# Patient Record
Sex: Male | Born: 1935 | Race: White | Hispanic: No | Smoking: Former smoker
Health system: Southern US, Community
[De-identification: ages and names within clinical notes are randomized; demographics above are authoritative.]

## PROBLEM LIST (undated history)

## (undated) DIAGNOSIS — I2510 Atherosclerotic heart disease of native coronary artery without angina pectoris: Secondary | ICD-10-CM

## (undated) DIAGNOSIS — E785 Hyperlipidemia, unspecified: Secondary | ICD-10-CM

## (undated) DIAGNOSIS — I4891 Unspecified atrial fibrillation: Secondary | ICD-10-CM

## (undated) DIAGNOSIS — I10 Essential (primary) hypertension: Secondary | ICD-10-CM

## (undated) HISTORY — PX: FRACTURE SURGERY: SHX138

---

## 2003-12-30 ENCOUNTER — Other Ambulatory Visit: Payer: Self-pay

## 2003-12-30 ENCOUNTER — Emergency Department: Payer: Self-pay | Admitting: Emergency Medicine

## 2003-12-31 ENCOUNTER — Emergency Department: Payer: Self-pay | Admitting: Unknown Physician Specialty

## 2006-06-20 ENCOUNTER — Other Ambulatory Visit: Payer: Self-pay

## 2006-06-20 ENCOUNTER — Inpatient Hospital Stay: Payer: Self-pay | Admitting: *Deleted

## 2008-03-16 ENCOUNTER — Emergency Department: Payer: Self-pay | Admitting: Emergency Medicine

## 2008-12-29 ENCOUNTER — Emergency Department: Payer: Self-pay | Admitting: Emergency Medicine

## 2009-06-24 ENCOUNTER — Emergency Department: Payer: Self-pay | Admitting: Emergency Medicine

## 2009-06-30 ENCOUNTER — Emergency Department: Payer: Self-pay | Admitting: Emergency Medicine

## 2009-07-04 ENCOUNTER — Emergency Department: Payer: Self-pay | Admitting: Unknown Physician Specialty

## 2012-02-27 ENCOUNTER — Emergency Department: Payer: Self-pay | Admitting: Emergency Medicine

## 2012-02-27 LAB — CBC WITH DIFFERENTIAL/PLATELET
Basophil %: 0.8 %
Eosinophil #: 0.2 10*3/uL (ref 0.0–0.7)
Eosinophil %: 2.4 %
HCT: 45.8 % (ref 40.0–52.0)
HGB: 15.4 g/dL (ref 13.0–18.0)
MCH: 30.5 pg (ref 26.0–34.0)
MCHC: 33.7 g/dL (ref 32.0–36.0)
Monocyte #: 0.5 x10 3/mm (ref 0.2–1.0)
Monocyte %: 5.6 %
Neutrophil #: 7.2 10*3/uL — ABNORMAL HIGH (ref 1.4–6.5)
Neutrophil %: 76.6 %
Platelet: 209 10*3/uL (ref 150–440)
RBC: 5.06 10*6/uL (ref 4.40–5.90)
WBC: 9.4 10*3/uL (ref 3.8–10.6)

## 2012-02-27 LAB — TROPONIN I: Troponin-I: <0.02 ng/mL

## 2012-02-27 LAB — BASIC METABOLIC PANEL
Anion Gap: 8 (ref 7–16)
BUN: 11 mg/dL (ref 7–18)
Chloride: 106 mmol/L (ref 98–107)
Creatinine: 0.82 mg/dL (ref 0.60–1.30)
Glucose: 109 mg/dL — ABNORMAL HIGH (ref 65–99)
Osmolality: 278 (ref 275–301)

## 2012-03-04 LAB — CULTURE, BLOOD (SINGLE)

## 2012-04-10 ENCOUNTER — Emergency Department: Payer: Self-pay | Admitting: Emergency Medicine

## 2016-11-27 ENCOUNTER — Other Ambulatory Visit: Payer: Self-pay

## 2016-11-27 ENCOUNTER — Inpatient Hospital Stay
Admission: EM | Admit: 2016-11-27 | Discharge: 2016-12-05 | DRG: 871 | Disposition: E | Payer: Medicare Other | Attending: Specialist | Admitting: Specialist

## 2016-11-27 ENCOUNTER — Encounter: Payer: Self-pay | Admitting: Internal Medicine

## 2016-11-27 ENCOUNTER — Emergency Department: Payer: Medicare Other

## 2016-11-27 ENCOUNTER — Inpatient Hospital Stay (HOSPITAL_COMMUNITY)
Admission: AD | Admit: 2016-11-27 | Payer: Self-pay | Source: Other Acute Inpatient Hospital | Admitting: Family Medicine

## 2016-11-27 DIAGNOSIS — Z515 Encounter for palliative care: Secondary | ICD-10-CM | POA: Diagnosis not present

## 2016-11-27 DIAGNOSIS — I719 Aortic aneurysm of unspecified site, without rupture: Secondary | ICD-10-CM | POA: Diagnosis not present

## 2016-11-27 DIAGNOSIS — Z91041 Radiographic dye allergy status: Secondary | ICD-10-CM | POA: Diagnosis not present

## 2016-11-27 DIAGNOSIS — T796XXA Traumatic ischemia of muscle, initial encounter: Secondary | ICD-10-CM

## 2016-11-27 DIAGNOSIS — J9601 Acute respiratory failure with hypoxia: Secondary | ICD-10-CM | POA: Diagnosis present

## 2016-11-27 DIAGNOSIS — I4891 Unspecified atrial fibrillation: Secondary | ICD-10-CM | POA: Diagnosis present

## 2016-11-27 DIAGNOSIS — N390 Urinary tract infection, site not specified: Secondary | ICD-10-CM | POA: Diagnosis present

## 2016-11-27 DIAGNOSIS — S2241XA Multiple fractures of ribs, right side, initial encounter for closed fracture: Secondary | ICD-10-CM | POA: Diagnosis not present

## 2016-11-27 DIAGNOSIS — I10 Essential (primary) hypertension: Secondary | ICD-10-CM | POA: Diagnosis not present

## 2016-11-27 DIAGNOSIS — R29731 NIHSS score 31: Secondary | ICD-10-CM | POA: Diagnosis not present

## 2016-11-27 DIAGNOSIS — R4182 Altered mental status, unspecified: Secondary | ICD-10-CM | POA: Diagnosis not present

## 2016-11-27 DIAGNOSIS — R402132 Coma scale, eyes open, to sound, at arrival to emergency department: Secondary | ICD-10-CM | POA: Diagnosis not present

## 2016-11-27 DIAGNOSIS — I2510 Atherosclerotic heart disease of native coronary artery without angina pectoris: Secondary | ICD-10-CM | POA: Diagnosis not present

## 2016-11-27 DIAGNOSIS — G9340 Encephalopathy, unspecified: Secondary | ICD-10-CM | POA: Diagnosis present

## 2016-11-27 DIAGNOSIS — I63512 Cerebral infarction due to unspecified occlusion or stenosis of left middle cerebral artery: Secondary | ICD-10-CM

## 2016-11-27 DIAGNOSIS — I248 Other forms of acute ischemic heart disease: Secondary | ICD-10-CM | POA: Diagnosis not present

## 2016-11-27 DIAGNOSIS — R6520 Severe sepsis without septic shock: Secondary | ICD-10-CM | POA: Diagnosis present

## 2016-11-27 DIAGNOSIS — A419 Sepsis, unspecified organism: Secondary | ICD-10-CM | POA: Diagnosis not present

## 2016-11-27 DIAGNOSIS — R402222 Coma scale, best verbal response, incomprehensible words, at arrival to emergency department: Secondary | ICD-10-CM | POA: Diagnosis not present

## 2016-11-27 DIAGNOSIS — Z66 Do not resuscitate: Secondary | ICD-10-CM | POA: Diagnosis not present

## 2016-11-27 DIAGNOSIS — R778 Other specified abnormalities of plasma proteins: Secondary | ICD-10-CM

## 2016-11-27 DIAGNOSIS — Z87891 Personal history of nicotine dependence: Secondary | ICD-10-CM | POA: Diagnosis not present

## 2016-11-27 DIAGNOSIS — L089 Local infection of the skin and subcutaneous tissue, unspecified: Secondary | ICD-10-CM | POA: Diagnosis present

## 2016-11-27 DIAGNOSIS — R402342 Coma scale, best motor response, flexion withdrawal, at arrival to emergency department: Secondary | ICD-10-CM | POA: Diagnosis present

## 2016-11-27 DIAGNOSIS — I639 Cerebral infarction, unspecified: Secondary | ICD-10-CM | POA: Diagnosis present

## 2016-11-27 DIAGNOSIS — R7989 Other specified abnormal findings of blood chemistry: Secondary | ICD-10-CM

## 2016-11-27 DIAGNOSIS — I447 Left bundle-branch block, unspecified: Secondary | ICD-10-CM | POA: Diagnosis not present

## 2016-11-27 DIAGNOSIS — S2249XA Multiple fractures of ribs, unspecified side, initial encounter for closed fracture: Secondary | ICD-10-CM | POA: Diagnosis present

## 2016-11-27 DIAGNOSIS — M84431P Pathological fracture, right ulna, subsequent encounter for fracture with malunion: Secondary | ICD-10-CM | POA: Diagnosis present

## 2016-11-27 DIAGNOSIS — E785 Hyperlipidemia, unspecified: Secondary | ICD-10-CM | POA: Diagnosis present

## 2016-11-27 DIAGNOSIS — S40811A Abrasion of right upper arm, initial encounter: Secondary | ICD-10-CM

## 2016-11-27 DIAGNOSIS — T148XXA Other injury of unspecified body region, initial encounter: Secondary | ICD-10-CM

## 2016-11-27 DIAGNOSIS — N179 Acute kidney failure, unspecified: Secondary | ICD-10-CM

## 2016-11-27 DIAGNOSIS — M6282 Rhabdomyolysis: Secondary | ICD-10-CM | POA: Diagnosis present

## 2016-11-27 DIAGNOSIS — S2239XA Fracture of one rib, unspecified side, initial encounter for closed fracture: Secondary | ICD-10-CM | POA: Diagnosis present

## 2016-11-27 DIAGNOSIS — Z23 Encounter for immunization: Secondary | ICD-10-CM

## 2016-11-27 HISTORY — DX: Atherosclerotic heart disease of native coronary artery without angina pectoris: I25.10

## 2016-11-27 HISTORY — DX: Hyperlipidemia, unspecified: E78.5

## 2016-11-27 HISTORY — DX: Essential (primary) hypertension: I10

## 2016-11-27 HISTORY — DX: Unspecified atrial fibrillation: I48.91

## 2016-11-27 LAB — URINALYSIS, COMPLETE (UACMP) WITH MICROSCOPIC
BILIRUBIN URINE: NEGATIVE
Bacteria, UA: NONE SEEN
Glucose, UA: NEGATIVE mg/dL
KETONES UR: NEGATIVE mg/dL
Leukocytes, UA: NEGATIVE
Nitrite: NEGATIVE
PROTEIN: 100 mg/dL — AB
Specific Gravity, Urine: 1.023 (ref 1.005–1.030)
pH: 5 (ref 5.0–8.0)

## 2016-11-27 LAB — COMPREHENSIVE METABOLIC PANEL
ALK PHOS: 91 U/L (ref 38–126)
ALT: 33 U/L (ref 17–63)
AST: 55 U/L — ABNORMAL HIGH (ref 15–41)
Albumin: 4 g/dL (ref 3.5–5.0)
Anion gap: 13 (ref 5–15)
BUN: 57 mg/dL — ABNORMAL HIGH (ref 6–20)
CALCIUM: 9.6 mg/dL (ref 8.9–10.3)
CHLORIDE: 103 mmol/L (ref 101–111)
CO2: 27 mmol/L (ref 22–32)
CREATININE: 1.32 mg/dL — AB (ref 0.61–1.24)
GFR, EST AFRICAN AMERICAN: 55 mL/min — AB (ref >60)
GFR, EST NON AFRICAN AMERICAN: 48 mL/min — AB (ref >60)
Glucose, Bld: 128 mg/dL — ABNORMAL HIGH (ref 65–99)
Potassium: 4.4 mmol/L (ref 3.5–5.1)
Sodium: 143 mmol/L (ref 135–145)
Total Bilirubin: 3.6 mg/dL — ABNORMAL HIGH (ref 0.3–1.2)
Total Protein: 7.5 g/dL (ref 6.5–8.1)

## 2016-11-27 LAB — TROPONIN I
TROPONIN I: 0.36 ng/mL — AB (ref <0.03)
TROPONIN I: 0.38 ng/mL — AB (ref <0.03)
Troponin I: 0.36 ng/mL (ref <0.03)

## 2016-11-27 LAB — CBC WITH DIFFERENTIAL/PLATELET
Basophils Absolute: 0 10*3/uL (ref 0–0.1)
Basophils Relative: 0 %
Eosinophils Absolute: 0 10*3/uL (ref 0–0.7)
Eosinophils Relative: 0 %
HEMATOCRIT: 52.8 % — AB (ref 40.0–52.0)
HEMOGLOBIN: 17.8 g/dL (ref 13.0–18.0)
LYMPHS ABS: 0.5 10*3/uL — AB (ref 1.0–3.6)
Lymphocytes Relative: 6 %
MCH: 30.4 pg (ref 26.0–34.0)
MCHC: 33.8 g/dL (ref 32.0–36.0)
MCV: 90.2 fL (ref 80.0–100.0)
MONO ABS: 0.6 10*3/uL (ref 0.2–1.0)
MONOS PCT: 6 %
NEUTROS ABS: 8.6 10*3/uL — AB (ref 1.4–6.5)
NEUTROS PCT: 88 %
Platelets: 188 10*3/uL (ref 150–440)
RBC: 5.85 MIL/uL (ref 4.40–5.90)
RDW: 14.9 % — AB (ref 11.5–14.5)
WBC: 9.8 10*3/uL (ref 3.8–10.6)

## 2016-11-27 LAB — PROTIME-INR
INR: 1.1
Prothrombin Time: 14.1 seconds (ref 11.4–15.2)

## 2016-11-27 LAB — LACTIC ACID, PLASMA
Lactic Acid, Venous: 1.8 mmol/L (ref 0.5–1.9)
Lactic Acid, Venous: 1.4 mmol/L (ref 0.5–1.9)

## 2016-11-27 LAB — CK: CK TOTAL: 1054 U/L — AB (ref 49–397)

## 2016-11-27 MED ORDER — PIPERACILLIN-TAZOBACTAM 3.375 G IVPB 30 MIN
3.3750 g | Freq: Once | INTRAVENOUS | Status: AC
  Administered 2016-11-27: 3.375 g via INTRAVENOUS
  Filled 2016-11-27: qty 50

## 2016-11-27 MED ORDER — TETANUS-DIPHTH-ACELL PERTUSSIS 5-2.5-18.5 LF-MCG/0.5 IM SUSP
0.5000 mL | Freq: Once | INTRAMUSCULAR | Status: AC
  Administered 2016-11-27: 0.5 mL via INTRAMUSCULAR

## 2016-11-27 MED ORDER — DILTIAZEM HCL 25 MG/5ML IV SOLN
10.0000 mg | Freq: Once | INTRAVENOUS | Status: DC
  Filled 2016-11-27: qty 5

## 2016-11-27 MED ORDER — FENTANYL CITRATE (PF) 100 MCG/2ML IJ SOLN
INTRAMUSCULAR | Status: AC
  Filled 2016-11-27: qty 2

## 2016-11-27 MED ORDER — SODIUM CHLORIDE 0.9 % IV BOLUS (SEPSIS)
1000.0000 mL | Freq: Once | INTRAVENOUS | Status: AC
  Administered 2016-11-27: 1000 mL via INTRAVENOUS

## 2016-11-27 MED ORDER — DILTIAZEM HCL 100 MG IV SOLR
5.0000 mg/h | INTRAVENOUS | Status: DC
  Administered 2016-11-27 – 2016-11-28 (×2): 5 mg/h via INTRAVENOUS
  Filled 2016-11-27 (×2): qty 100

## 2016-11-27 MED ORDER — FENTANYL CITRATE (PF) 100 MCG/2ML IJ SOLN
50.0000 ug | Freq: Once | INTRAMUSCULAR | Status: AC
  Administered 2016-11-27: 50 ug via INTRAVENOUS

## 2016-11-27 MED ORDER — LACTATED RINGERS IV BOLUS (SEPSIS)
1000.0000 mL | Freq: Once | INTRAVENOUS | Status: AC
  Administered 2016-11-27: 1000 mL via INTRAVENOUS

## 2016-11-27 MED ORDER — PIPERACILLIN-TAZOBACTAM 3.375 G IVPB 30 MIN
INTRAVENOUS | Status: AC
  Filled 2016-11-27: qty 50

## 2016-11-27 MED ORDER — METOPROLOL TARTRATE 5 MG/5ML IV SOLN
2.5000 mg | Freq: Once | INTRAVENOUS | Status: AC
  Administered 2016-11-27: 2.5 mg via INTRAVENOUS
  Filled 2016-11-27: qty 5

## 2016-11-27 MED ORDER — DILTIAZEM LOAD VIA INFUSION
10.0000 mg | Freq: Once | INTRAVENOUS | Status: DC
  Filled 2016-11-27: qty 10

## 2016-11-27 MED ORDER — FENTANYL CITRATE (PF) 100 MCG/2ML IJ SOLN
50.0000 ug | Freq: Once | INTRAMUSCULAR | Status: AC
  Administered 2016-11-27: 50 ug via INTRAVENOUS
  Filled 2016-11-27: qty 2

## 2016-11-27 MED ORDER — VANCOMYCIN HCL IN DEXTROSE 1-5 GM/200ML-% IV SOLN
1000.0000 mg | Freq: Once | INTRAVENOUS | Status: AC
  Administered 2016-11-27: 1000 mg via INTRAVENOUS

## 2016-11-27 MED ORDER — PIPERACILLIN-TAZOBACTAM 3.375 G IVPB 30 MIN
3.3750 g | Freq: Once | INTRAVENOUS | Status: AC
  Administered 2016-11-27: 3.375 g via INTRAVENOUS

## 2016-11-27 MED ORDER — MAGNESIUM SULFATE 2 GM/50ML IV SOLN
2.0000 g | INTRAVENOUS | Status: AC
  Administered 2016-11-27: 2 g via INTRAVENOUS
  Filled 2016-11-27: qty 50

## 2016-11-27 MED ORDER — DILTIAZEM HCL 25 MG/5ML IV SOLN
10.0000 mg | Freq: Once | INTRAVENOUS | Status: AC
  Administered 2016-11-27: 10 mg via INTRAVENOUS
  Filled 2016-11-27: qty 5

## 2016-11-27 NOTE — ED Notes (Addendum)
Pt has rhonchi on exhalation, no inhalation rhonch to bilateral upper lobes. Pt with diminished lower lobe breath sounds, pt remains in Lbbb with multifocal PVCs.

## 2016-11-27 NOTE — ED Triage Notes (Addendum)
Pt comes in as Emergency Traffic.  Per EMS pt comes from home and has not been heard from "in a few days".  A friend went to check on him today and found him on the floor covered in blood and feces.  Pt responds to pain only.  Pt has an open fracture on left arm.

## 2016-11-27 NOTE — ED Notes (Signed)
Dr. Scotty Court in to reexamine.

## 2016-11-27 NOTE — ED Provider Notes (Signed)
Clinical Course as of Nov 28 2330  Wynelle Link 22-Dec-2016  1741 CK elevated, will give IVF for hydration and renal protection.   [PS]  1817 Pt now with increased WOB, accessory muscle use, hypoxia to 87% on 2L [place]. Placed on NRB. Will repeat CXR  [PS]  1818 Repeat ekg obtained, interpreted by me, shows a fib, rate 97, left axis, LBBB. 2 PVCs on the strip. Not significantly changed from initial ed ekg.  [PS]  1847 Persistent high bp, 180/80 just now. Has expiratory crackles, but clear on inspiration, suspect mucous in airway. Repeat cxr clear. Will give trial of fentanyl. Considering repeat CT with IV contrast/angio, but I'm hesitant to do so given the rhabdomyolysis which will put him at high risk for renal injury.  [PS]  2036 Persistent afib/rvr. Will start dilt drip, careful titration  [PS]  2103 Bp normalized. Respiratory status stable - so2 98% on nrb. Tachypnea. Trop stable, not sig. Increasing.  Likely elevated due to rhabdo  [PS]  2300 Update from [facility] is that although they have accepted the patient, they currently do not plan to make a bed avaialble to him, essentially preventing transfer.  [facility] also still has no timeframe on when they might be able to assign a bed to the patient.   [PS]    Clinical Course User Index [PS] Sharman Cheek, MD     ----------------------------------------- 11:33 PM on 2016/12/22 -----------------------------------------  Discussed with [facility] neurosurgery to review the CT finding of petechial hemorrhage, they report there is nothing to do with this and not neurosurgical.  Case d/w hospitalist for admission to [facility].  Pt is a [place] patient, but currently not stable for transfer to [place] due to afib with RVR, elevated troponin, labile blood pressures.  Notification form sent to [facility].   Additional critical care performed by me: CRITICAL CARE Performed by: Scotty Court, Alinah Sheard   Total critical care time: 60 minutes  Critical care time was exclusive of separately billable  procedures and treating other patients.  Critical care was necessary to treat or prevent imminent or life-threatening deterioration.  Critical care was time spent personally by me on the following activities: development of treatment plan with patient and/or surrogate as well as nursing, discussions with consultants, evaluation of patient's response to treatment, examination of patient, obtaining history from patient or surrogate, ordering and performing treatments and interventions, ordering and review of laboratory studies, ordering and review of radiographic studies, pulse oximetry and re-evaluation of patient's condition.  Final diagnoses:  Atrial fibrillation with rapid ventricular response (HCC)  Troponin level elevated  Acute ischemic left MCA stroke (HCC)  Abrasion of right arm, initial encounter  Acute renal failure, unspecified acute renal failure type (HCC)  Closed fracture of multiple ribs of right side, initial encounter  Traumatic rhabdomyolysis, initial encounter [facility])       Sharman Cheek, MD 12/22/2016 2335

## 2016-11-27 NOTE — ED Notes (Signed)
Pt rolled and cleansed of dried blood and stool. Right elbow cleansed and dressed. Pt with skin tear noted to right elbow encapsulating right elbow, pt with dried abrasions and scabbed lacerations noted to right forearm. Right arm is bruised from fingers to mid upper arm. Pt with bruising noted to right upper tubercle, right forehead and posterior skull. Pt with red discrete bumps noted to lower back and bruising noted to buttocks. Pt with abrasion without drainage noted to right lateral knee.

## 2016-11-27 NOTE — ED Provider Notes (Signed)
[facility] Emergency Department Provider Note ____________________________________________   I have reviewed the triage vital signs and the triage nursing note.  HISTORY  Chief Complaint Fall   Historian Patient history limited by altered mental status. Level 5 caveat  History per distant family member as well as EMS report.  HPI [place] Makail Watling. is a ((( Unsure of Birthdate)) 83 y.o. male brought in by EMS after being found unresponsive on the floor, apparently last had been spoken 2/24 hours ago.  Patient was found in a pool of blood with an abrasion to his right forearm with a deformity there. He was not following much commands and minimally verbal but appeared to be breathing okay and protecting his airway.  Unclear whether or not the patient was having any pain although he seem to moan when you press on his abdomen.      No past medical history on file.  There are no active problems to display for this patient.   No past surgical history on file.  Prior to Admission medications   Not on File    Not on File  No family history on file.  Social History Social History  Substance Use Topics  . Smoking status: Not on file  . Smokeless tobacco: Not on file  . Alcohol use Not on file    Review of Systems   history limited by altered mental status Level 5 caveat  ____________________________________________   PHYSICAL EXAM:  VITAL SIGNS: ED Triage Vitals [12/02/2016 1053]  Enc Vitals Group     BP 110/88     Pulse Rate (!) 126     Resp (!) 22     Temp      Temp src      SpO2 95 %     Weight      Height      Head Circumference      Peak Flow      Pain Score      Pain Loc      Pain Edu?      Excl. in GC?      Constitutional: Alert to voice, but only occasionally answering questions. HEENT   Head: Normocephalic and atraumatic.      Eyes: Conjunctivae are normal. Pupils equal and round.       Ears:          Nose: No congestion/rhinnorhea.   Mouth/Throat: Mucous membranes are severely dry.   Neck: No stridor.  no midline C-spine step-offs. Cardiovascular/Chest: tachycardic, regular rhythm.  No murmurs, rubs, or gallops. Respiratory: Normal respiratory effort without tachypnea nor retractions. Breath sounds are clear and equal bilaterally. No wheezes/rales/rhonchi. Gastrointestinal: Soft.possibly mildly distended. Questionable tenderness but without any guarding or rebound.  Genitourinary/rectal:Deferred Musculoskeletal: pelvis stable. No thoracic or lumbar step-offs. Mild ecchymosis left flank. Pelvis stable. No lower extremity deformities. Right forearm deformity with an abrasion of the right forearm. Neurologic:  no facial droop. There is some aphasia although unclear due to extremely dry mouth. Appears to be moving 4 extremities, but extremely weak overall. Skin:  Skin is warm, dry.   ____________________________________________  LABS (pertinent positives/negatives) I, Governor Rooks, MD the attending physician have reviewed the labs noted below.  Labs Reviewed  COMPREHENSIVE METABOLIC PANEL - Abnormal; Notable for the following:       Result Value   Glucose, Bld 128 (*)    BUN 57 (*)    Creatinine, Ser 1.32 (*)    AST 55 (*)  Total Bilirubin 3.6 (*)    GFR calc non Af Amer 48 (*)    GFR calc Af Amer 55 (*)    All other components within normal limits  TROPONIN I - Abnormal; Notable for the following:    Troponin I 0.36 (*)    All other components within normal limits  CBC WITH DIFFERENTIAL/PLATELET - Abnormal; Notable for the following:    HCT 52.8 (*)    RDW 14.9 (*)    Neutro Abs 8.6 (*)    Lymphs Abs 0.5 (*)    All other components within normal limits  URINALYSIS, COMPLETE (UACMP) WITH MICROSCOPIC - Abnormal; Notable for the following:    Color, Urine AMBER (*)    APPearance CLOUDY (*)    Hgb urine dipstick MODERATE (*)    Protein, ur 100 (*)    Squamous Epithelial  / LPF 0-5 (*)    All other components within normal limits  BLOOD GAS, VENOUS - Abnormal; Notable for the following:    pO2, Ven <31.0 (*)    All other components within normal limits  TROPONIN I - Abnormal; Notable for the following:    Troponin I 0.36 (*)    All other components within normal limits  URINE CULTURE  CULTURE, BLOOD (ROUTINE X 2)  CULTURE, BLOOD (ROUTINE X 2)  LACTIC ACID, PLASMA  PROTIME-INR  LACTIC ACID, PLASMA  CK  TYPE AND SCREEN  TYPE AND SCREEN    ____________________________________________    EKG I, Governor Rooks, MD, the attending physician have personally viewed and interpreted all ECGs.  114 bpm. Atrial fibrillation with rapid ventricular response. Left bundle branch block. Occasional PVC. Left axis deviation.  No prior EKG for comparison. ____________________________________________  RADIOLOGY All Xrays were viewed by me.  Imaging interpreted by Radiologist, and I, Governor Rooks, MD the attending physician have reviewed the radiologist interpretation noted below.  CT chest and pelvis without contrast:  IMPRESSION: 1. Nondisplaced fractures of the right anterior third and fourth ribs with no pneumothorax. 2. Emphysematous changes in the lungs. Atherosclerotic changes in the thoracic aorta. 3. Significant perinephric stranding bilaterally, right greater than left. This is an age-indeterminate finding. There is no associated hydronephrosis. I suspect the stranding on the left is chronic. However, the stranding on the right is much greater and an the underlying acute process is not excluded. A small fluid collection between the liver and right kidney is thought to be renal in origin. Renal injury or infection could result in these findings. Sequela of acute renal insufficiency is a possibility. Recommend clinical correlation and follow-up as clinically warranted. 4. Atherosclerotic changes in the abdominal aorta and branching vessels. 5.  Age-indeterminate anterior wedging of L1 and L4. Aortic aneurysm NOS (ICD10-I71.9).   CT head and cervical spine without contrast:  IMPRESSION: 1. Moderate-sized area of abnormal mixed density in the left hemisphere centered at the insula and operculum most resembles a subacute anterior division Left MCA infarct with petechial hemorrhage, with evidence of a hyperdense vessel in the left sylvian fissure. Follow-up Brain MRI and intracranial MRA would confirm. 2. No malignant hemorrhagic transformation or intracranial mass effect. 3. Broad-based scalp hematoma without underlying skull fracture. 4. No acute fracture or listhesis in the cervical spine. 5. Extensive great vessel and carotid artery calcified Atherosclerosis.  Chest portable:  IMPRESSION: No active disease.  Forearm right:  IMPRESSION: 1. Chronic and healed but abnormally angulated distal right radius fracture. Associated chronic ulnar styloid fracture. 2.  No acute osseous abnormality identified. 3.  Calcified peripheral vascular disease. __________________________________________  PROCEDURES  Procedure(s) performed: None  Critical Care performed: CRITICAL CARE Performed by: Governor Rooks   Total critical care time: 75 minutes  Critical care time was exclusive of separately billable procedures and treating other patients.  Critical care was necessary to treat or prevent imminent or life-threatening deterioration.  Critical care was time spent personally by me on the following activities: development of treatment plan with patient and/or surrogate as well as nursing, discussions with consultants, evaluation of patient's response to treatment, examination of patient, obtaining history from patient or surrogate, ordering and performing treatments and interventions, ordering and review of laboratory studies, ordering and review of radiographic studies, pulse oximetry and re-evaluation of patient's  condition.   ____________________________________________   ED COURSE / ASSESSMENT AND PLAN  Pertinent labs & imaging results that were available during my care of the patient were reviewed by me and considered in my medical decision making (see chart for details).    Mr. Segal arrived by EMS with altered mental status, looked extremely dry and had trouble speaking felt to be due to both altered mental status as well as dry mouth. Patient had abrasion over his right wrist with a deformity which initially was concerning for open fracture, however x-ray confirmed chronic deformity.  Patient had hypotension as well as tachycardia and there is concern for possible sepsis, patient was given broad-spectrum antibiotics. Laboratory studies ultimately showed fairly reassuring with no elevated lactate, or elevated white blood cell count. No clear sources of infection.  Patient found to have renal dysfunction, no prior for comparison, but with elevated BUN and creatinine, likely component of dehydration. Patient's troponin is elevated at 0.36. Patient is having rapid A. fib with left bundle branch block. There is no prior EKG to compare whether or not he has a history of A. fib, or history of left bundle branch block.  In any case and a coagulation including aspirin or heparin is problematic given his punctate hemorrhage of his ischemic stroke as well as possible intra-abdominal bleeding around the kidney from the trauma.  Blood pressure without hypotension. Heart rate came down from the 130s down to 110. I did give patient a dose of metoprolol.  Patient is a family member, unclear whether or not this was an uncle or the nephew, but in any case this person states that he checks up and speaks with the patient once per morning. He had spoken with him on Friday morning and then had not heard from him all day Saturday and checked on him today and was found to be on the floor with a bloody right arm and  confused.  This family member was unsure of the patient's full name, 8 of birth, and social security number, and there was little uncomfortable necessarily using this family members as a surrogate for decision making.  Patient appeared to be protecting his airway although is confused. No indication for emergent intubation.  Patient will need hospitalization for subacute ischemic with hemorrhagic converted stroke. Because of the traumatic aspect of the multiple rib fractures and this possibility of intra-abdominal bleeding although likely stable at this point a day or so from the onset, I did initially plan for transfer to a larger center that would have backup trauma surgery as well as neurology.  I initially spoke with [facility] and spoke with neurology who did not recommend any additional acute intervention, but would need hospital management for the stroke. I spoke with the trauma surgeon who  indicated no acute emergency intervention, the load be happy to consult. The hospitalist was happy to admit, however there is no stepdown beds available.  I spoke with [facility] and the same situation, there is no stepdown beds available.  Plan Will be to assess the patient's placement on these admission for transfer lists over the next several hours. An alternative plan might be too potentially stated our hospital although we do not have trauma surgery.  Patient care transferred to Dr. Scotty Court at shift change 4 PM.  Repeat troponin was ordered. An anticoagulation is to be concerning given above punctate hemorrhagic stroke, as well as the possible of interim abdominal traumatic bleeding.  DIFFERENTIAL DIAGNOSIS: Including but not limited to sepsis, stroke, rhabdomyolysis, renal failure, heart failure, heart attack, etc.   CONSULTATIONS:   Multiple consultations with the [employer], I spoke with Dr. Jerrell Belfast with neurology, as well as the on-call trauma doctor who both agreed patient could be seen in  consultation through the hospitalist service at [facility]. I spoke with Dr. Adrian Blackwater, hospitalist at [facility] who did accept in transfer, was placed on waiting list due to no stepdown peds. Patient not qualifying for ICU level care given not on drips or on a ventilator at this time.  I spoke with ICU physician at [facility], Dr. Rito Ehrlich (sp?), who is agreeable to admit, but was waiting for a couple of other phone discussions before formal agreement for transfer.  I was called back to the patient would not be accepted to the ICU, but would be accepted for stepdown bed which is unavailable at this point time. Unclear when this that might be available.   Patient / Family / Caregiver informed of clinical course, medical decision-making process, and agree with plan.   ___________________________________________   FINAL CLINICAL IMPRESSION(S) / ED DIAGNOSES   Final diagnoses:  Atrial fibrillation with rapid ventricular response (HCC)  Troponin level elevated  Acute ischemic left MCA stroke (HCC)  Abrasion of right arm, initial encounter  Acute renal failure, unspecified acute renal failure type (HCC)  Closed fracture of multiple ribs of right side, initial encounter              Note: This dictation was prepared with Dragon dictation. Any transcriptional errors that result from this process are unintentional    Governor Rooks, MD 12-24-16 1626

## 2016-11-27 NOTE — ED Notes (Signed)
Neuro status unchanged, pt with improved breath sounds in all lung fields.

## 2016-11-27 NOTE — H&P (Signed)
[facility] NAME: Kyle Vega    MR#:  [id number]  DATE OF BIRTH:  01/04/1931  DATE OF ADMISSION:  11/21/2016  PRIMARY CARE PHYSICIAN: [facility] [place]   REQUESTING/REFERRING PHYSICIAN: Joni Fears, MD  CHIEF COMPLAINT:   Chief Complaint  Patient presents with  . Fall    HISTORY OF PRESENT ILLNESS:  Kyle Vega  is a 83 y.o. male who presents after being found down at home. Last known well was 2 days ago. Patient was found here on CT scan to have left MCA territory stroke. He does open his eyes to verbal command, but does not provide any history otherwise. He was also found to have rhabdomyolysis, right-sided weakness, skin lesions likely infected.  On imaging he also was found to have 2 rib fractures, and some perinephric stranding likely indicating infection. He met severe sepsis criteria. Hospitalists were called for admission  PAST MEDICAL HISTORY:   Past Medical History:  Diagnosis Date  . Atrial fibrillation ([place])   . CAD (coronary artery disease)   . HLD (hyperlipidemia)   . HTN (hypertension)     PAST SURGICAL HISTORY:   Past Surgical History:  Procedure Laterality Date  . FRACTURE SURGERY      SOCIAL HISTORY:   Social History  Substance Use Topics  . Smoking status: Former Research scientist (life sciences)  . Smokeless tobacco: Not on file  . Alcohol use No    FAMILY HISTORY:   Family History  Problem Relation Age of Onset  . Family history unknown: Yes    DRUG ALLERGIES:  Not on File  MEDICATIONS AT HOME:   Prior to Admission medications   Not on File    REVIEW OF SYSTEMS:  Review of Systems  Unable to perform ROS: Acuity of condition     VITAL SIGNS:   Vitals:   11/13/2016 2300 12/02/2016 2315 11/17/2016 2330 12/03/2016 2345  BP: 106/66 121/70 (!) 141/73 (!) 141/80  Pulse: 76 (!) 58 (!) 51 (!) 36  Resp: (!) 25 (!) 23 (!) 29 (!) 29  Temp:      TempSrc:      SpO2: 98% 96% 97% 99%   Wt  Readings from Last 3 Encounters:  No data found for Wt    PHYSICAL EXAMINATION:  Physical Exam  Vitals reviewed. Constitutional: He appears well-developed and well-nourished. No distress.  HENT:  Head: Normocephalic and atraumatic.  Eyes: Pupils are equal, round, and reactive to light. Conjunctivae and EOM are normal. No scleral icterus.  Neck: Normal range of motion. Neck supple. No JVD present. No thyromegaly present.  Cardiovascular: Intact distal pulses.  Exam reveals no gallop and no friction rub.   No murmur heard. Tachycardic, irregular rhythm  Respiratory: Breath sounds normal. He is in respiratory distress. He has no wheezes. He has no rales.  GI: Soft. Bowel sounds are normal. He exhibits no distension. There is no tenderness.  Musculoskeletal: Normal range of motion. He exhibits no edema.  No arthritis, no gout  Lymphadenopathy:    He has no cervical adenopathy.  Neurological: He is alert. No cranial nerve deficit.  Patient will open his eyes and track, but is not speaking. He has spontaneous movement of his left upper and lower extremities, less spontaneous movement of his right lower extremity, and a right upper extremity paralysis.  Skin: Skin is warm and dry. No rash noted. There is erythema (with wounds and surrounding ecchymosis of the right upper extremity).  Psychiatric:  Unable to assess due to patient condition    LABORATORY PANEL:   CBC  Recent Labs Lab 11/20/2016 1104  WBC 9.8  HGB 17.8  HCT 52.8*  PLT 188   ------------------------------------------------------------------------------------------------------------------  Chemistries   Recent Labs Lab 11/06/2016 1104  NA 143  K 4.4  CL 103  CO2 27  GLUCOSE 128*  BUN 57*  CREATININE 1.32*  CALCIUM 9.6  AST 55*  ALT 33  ALKPHOS 91  BILITOT 3.6*   ------------------------------------------------------------------------------------------------------------------  Cardiac Enzymes  Recent  Labs Lab 11/15/2016 1838  TROPONINI 0.38*   ------------------------------------------------------------------------------------------------------------------  RADIOLOGY:  Ct Abdomen Pelvis Wo Contrast  Result Date: 11/08/2016 CLINICAL DATA:  Patient found down with acute mental status change. EXAM: CT CHEST, ABDOMEN AND PELVIS WITHOUT CONTRAST TECHNIQUE: Multidetector CT imaging of the chest, abdomen and pelvis was performed following the standard protocol without IV contrast. COMPARISON:  None. FINDINGS: CT CHEST FINDINGS Cardiovascular: Air in the right jugular, right subclavian, and left brachiocephalic veins is likely due to recent IV placement or injection. Atherosclerotic changes are seen in the nonaneurysmal thoracic aorta and its branching vessels. Cardiomegaly is noted without effusion. The central pulmonary arteries are normal in caliber. Mediastinum/Nodes: No effusions. The thyroid and esophagus are normal. No adenopathy. No mediastinal air. Lungs/Pleura: Central airways are normal. No pneumothorax. Emphysematous changes seen in the lungs. No nodule, mass, or focal infiltrate. Musculoskeletal: There are nondisplaced fractures of the right third and fourth anterior ribs with no pneumothorax. CT ABDOMEN PELVIS FINDINGS Hepatobiliary: Probable tiny cyst in the liver on series 2, image 51, too small to characterize. Another seen on image 59 and perhaps another on image 67. No solid masses. The gallbladder is unremarkable. Pancreas: Unremarkable. No pancreatic ductal dilatation or surrounding inflammatory changes. Spleen: Normal in size without focal abnormality. Adrenals/Urinary Tract: An adenoma is seen in the right adrenal gland. The left adrenal gland is normal. Vascular calcifications are associated with both kidneys. No renal stones or hydronephrosis. Significant perinephric stranding seen bilaterally, right greater than left. No ureterectasis or ureteral stones. The bladder is decompressed with  a Foley catheter. Air in the pelvis is thought to be within decompressed bladder. Stomach/Bowel: The stomach and small bowel are normal. The colon is otherwise normal. The appendix is normal. Vascular/Lymphatic: Atherosclerotic changes are seen in the nonaneurysmal abdominal aorta, iliac vessels, and femoral vessels. No adenopathy. Reproductive: Prostate is unremarkable. Other: There is significant stranding and background both kidneys as above, particularly on the right. There is an elliptical collection of fluid between the right kidney and liver measuring 3.9 by 1.9 cm. This is favored to be due to the right renal stranding in is no underlying hepatic abnormality is identified. Musculoskeletal: Anterior wedging of L1 and L4 is age indeterminate with no acute fracture line seen. IMPRESSION: 1. Nondisplaced fractures of the right anterior third and fourth ribs with no pneumothorax. 2. Emphysematous changes in the lungs. Atherosclerotic changes in the thoracic aorta. 3. Significant perinephric stranding bilaterally, right greater than left. This is an age-indeterminate finding. There is no associated hydronephrosis. I suspect the stranding on the left is chronic. However, the stranding on the right is much greater and an the underlying acute process is not excluded. A small fluid collection between the liver and right kidney is thought to be renal in origin. Renal injury or infection could result in these findings. Sequela of acute renal insufficiency is a possibility. Recommend clinical correlation and follow-up as clinically warranted. 4. Atherosclerotic changes in the abdominal aorta and  branching vessels. 5. Age-indeterminate anterior wedging of L1 and L4. Aortic aneurysm NOS (ICD10-I71.9). Electronically Signed   By: Dorise Bullion III M.D   On: 11/19/2016 12:25   Dg Forearm Right  Result Date: 11/26/2016 CLINICAL DATA:  Male of unknown age with right forearm deformity, unable to obtain history from  patient. Presumed trauma. EXAM: RIGHT FOREARM - 2 VIEW COMPARISON:  None. FINDINGS: Lateral and volar angulated deformity of the distal right radius metadiaphysis appears chronic and healed. There is an associated chronic appearing ulnar styloid fracture. No superimposed carpal bone dislocation identified, although there may be associated carpal degeneration. The underlying bone mineralization appears normal. Grossly normal alignment at the right elbow. Calcified peripheral vascular disease in the right forearm. IMPRESSION: 1. Chronic and healed but abnormally angulated distal right radius fracture. Associated chronic ulnar styloid fracture. 2.  No acute osseous abnormality identified. 3. Calcified peripheral vascular disease. Electronically Signed   By: Genevie Ann M.D.   On: 11/06/2016 11:41   Ct Head Wo Contrast  Result Date: 11/19/2016 CLINICAL DATA:  Male of unknown age found down after not being heard from in a few days. Found covered in blood and feces. EXAM: CT HEAD WITHOUT CONTRAST CT CERVICAL SPINE WITHOUT CONTRAST TECHNIQUE: Multidetector CT imaging of the head and cervical spine was performed following the standard protocol without intravenous contrast. Multiplanar CT image reconstructions of the cervical spine were also generated. COMPARISON:  None. FINDINGS: CT HEAD FINDINGS Brain: There is an oval 4 x 6 cm area of hypodensity involving both gray and white matter in the anterior left MCA territory affecting the insula and frontal operculum. Lesser involvement of the anterior left temporal lobe. Heterogeneous density within the affected area is compatible with petechial hemorrhage (coronal image 22). In the left sylvian fissure there is a hyperdense vessel (coronal image 28). There is no significant mass effect. No intracranial midline shift. Patent left lateral ventricle. No ventriculomegaly. No other intracranial hemorrhage identified. Gray-white matter differentiation elsewhere appears within normal  limits. Vascular: Calcified atherosclerosis at the skull base. Hyperdense vessel suspected in the left sylvian fissure as stated above. Skull: Osteopenia.  No skull fracture identified. Sinuses/Orbits: Mild anterior ethmoid and mild to moderate right sphenoid sinus mucosal thickening. Other visible paranasal sinuses and mastoids are well pneumatized. Other: Visualized orbit soft tissues are within normal limits. There is some calcified scalp vessel atherosclerosis. There is a mild broad-based vertex and right greater than left posterior convexity scalp hematoma. CT CERVICAL SPINE FINDINGS Alignment: Mildly exaggerated cervical lordosis. Bilateral posterior element alignment is within normal limits. Cervicothoracic junction alignment is within normal limits. Skull base and vertebrae: Osteopenia. Visualized skull base is intact. No atlanto-occipital dissociation. No cervical spine fracture identified. Soft tissues and spinal canal: No prevertebral fluid or swelling. No visible canal hematoma. Extensive bilateral carotid calcified atherosclerosis. Small volume retained secretions in the pharynx. Disc levels: Degenerative changes primarily at the anterior C1-odontoid, including joint space loss osteophytosis and subchondral cysts. Upper chest: Visible upper thoracic levels appear grossly intact. Centrilobular emphysema in the lung apices. Extensive calcified proximal great vessels. IMPRESSION: 1. Moderate-sized area of abnormal mixed density in the left hemisphere centered at the insula and operculum most resembles a subacute anterior division Left MCA infarct with petechial hemorrhage, with evidence of a hyperdense vessel in the left sylvian fissure. Follow-up Brain MRI and intracranial MRA would confirm. 2. No malignant hemorrhagic transformation or intracranial mass effect. 3. Broad-based scalp hematoma without underlying skull fracture. 4. No acute fracture or listhesis  in the cervical spine. 5. Extensive great  vessel and carotid artery calcified atherosclerosis. Electronically Signed   By: Genevie Ann M.D.   On: 11/07/2016 12:11   Ct Chest Wo Contrast  Result Date: 11/18/2016 CLINICAL DATA:  Patient found down with acute mental status change. EXAM: CT CHEST, ABDOMEN AND PELVIS WITHOUT CONTRAST TECHNIQUE: Multidetector CT imaging of the chest, abdomen and pelvis was performed following the standard protocol without IV contrast. COMPARISON:  None. FINDINGS: CT CHEST FINDINGS Cardiovascular: Air in the right jugular, right subclavian, and left brachiocephalic veins is likely due to recent IV placement or injection. Atherosclerotic changes are seen in the nonaneurysmal thoracic aorta and its branching vessels. Cardiomegaly is noted without effusion. The central pulmonary arteries are normal in caliber. Mediastinum/Nodes: No effusions. The thyroid and esophagus are normal. No adenopathy. No mediastinal air. Lungs/Pleura: Central airways are normal. No pneumothorax. Emphysematous changes seen in the lungs. No nodule, mass, or focal infiltrate. Musculoskeletal: There are nondisplaced fractures of the right third and fourth anterior ribs with no pneumothorax. CT ABDOMEN PELVIS FINDINGS Hepatobiliary: Probable tiny cyst in the liver on series 2, image 51, too small to characterize. Another seen on image 74 and perhaps another on image 67. No solid masses. The gallbladder is unremarkable. Pancreas: Unremarkable. No pancreatic ductal dilatation or surrounding inflammatory changes. Spleen: Normal in size without focal abnormality. Adrenals/Urinary Tract: An adenoma is seen in the right adrenal gland. The left adrenal gland is normal. Vascular calcifications are associated with both kidneys. No renal stones or hydronephrosis. Significant perinephric stranding seen bilaterally, right greater than left. No ureterectasis or ureteral stones. The bladder is decompressed with a Foley catheter. Air in the pelvis is thought to be within  decompressed bladder. Stomach/Bowel: The stomach and small bowel are normal. The colon is otherwise normal. The appendix is normal. Vascular/Lymphatic: Atherosclerotic changes are seen in the nonaneurysmal abdominal aorta, iliac vessels, and femoral vessels. No adenopathy. Reproductive: Prostate is unremarkable. Other: There is significant stranding and background both kidneys as above, particularly on the right. There is an elliptical collection of fluid between the right kidney and liver measuring 3.9 by 1.9 cm. This is favored to be due to the right renal stranding in is no underlying hepatic abnormality is identified. Musculoskeletal: Anterior wedging of L1 and L4 is age indeterminate with no acute fracture line seen. IMPRESSION: 1. Nondisplaced fractures of the right anterior third and fourth ribs with no pneumothorax. 2. Emphysematous changes in the lungs. Atherosclerotic changes in the thoracic aorta. 3. Significant perinephric stranding bilaterally, right greater than left. This is an age-indeterminate finding. There is no associated hydronephrosis. I suspect the stranding on the left is chronic. However, the stranding on the right is much greater and an the underlying acute process is not excluded. A small fluid collection between the liver and right kidney is thought to be renal in origin. Renal injury or infection could result in these findings. Sequela of acute renal insufficiency is a possibility. Recommend clinical correlation and follow-up as clinically warranted. 4. Atherosclerotic changes in the abdominal aorta and branching vessels. 5. Age-indeterminate anterior wedging of L1 and L4. Aortic aneurysm NOS (ICD10-I71.9). Electronically Signed   By: Dorise Bullion III M.D   On: 11/12/2016 12:25   Ct Cervical Spine Wo Contrast  Result Date: 11/23/2016 CLINICAL DATA:  Male of unknown age found down after not being heard from in a few days. Found covered in blood and feces. EXAM: CT HEAD WITHOUT  CONTRAST CT CERVICAL SPINE WITHOUT  CONTRAST TECHNIQUE: Multidetector CT imaging of the head and cervical spine was performed following the standard protocol without intravenous contrast. Multiplanar CT image reconstructions of the cervical spine were also generated. COMPARISON:  None. FINDINGS: CT HEAD FINDINGS Brain: There is an oval 4 x 6 cm area of hypodensity involving both gray and white matter in the anterior left MCA territory affecting the insula and frontal operculum. Lesser involvement of the anterior left temporal lobe. Heterogeneous density within the affected area is compatible with petechial hemorrhage (coronal image 22). In the left sylvian fissure there is a hyperdense vessel (coronal image 28). There is no significant mass effect. No intracranial midline shift. Patent left lateral ventricle. No ventriculomegaly. No other intracranial hemorrhage identified. Gray-white matter differentiation elsewhere appears within normal limits. Vascular: Calcified atherosclerosis at the skull base. Hyperdense vessel suspected in the left sylvian fissure as stated above. Skull: Osteopenia.  No skull fracture identified. Sinuses/Orbits: Mild anterior ethmoid and mild to moderate right sphenoid sinus mucosal thickening. Other visible paranasal sinuses and mastoids are well pneumatized. Other: Visualized orbit soft tissues are within normal limits. There is some calcified scalp vessel atherosclerosis. There is a mild broad-based vertex and right greater than left posterior convexity scalp hematoma. CT CERVICAL SPINE FINDINGS Alignment: Mildly exaggerated cervical lordosis. Bilateral posterior element alignment is within normal limits. Cervicothoracic junction alignment is within normal limits. Skull base and vertebrae: Osteopenia. Visualized skull base is intact. No atlanto-occipital dissociation. No cervical spine fracture identified. Soft tissues and spinal canal: No prevertebral fluid or swelling. No visible canal  hematoma. Extensive bilateral carotid calcified atherosclerosis. Small volume retained secretions in the pharynx. Disc levels: Degenerative changes primarily at the anterior C1-odontoid, including joint space loss osteophytosis and subchondral cysts. Upper chest: Visible upper thoracic levels appear grossly intact. Centrilobular emphysema in the lung apices. Extensive calcified proximal great vessels. IMPRESSION: 1. Moderate-sized area of abnormal mixed density in the left hemisphere centered at the insula and operculum most resembles a subacute anterior division Left MCA infarct with petechial hemorrhage, with evidence of a hyperdense vessel in the left sylvian fissure. Follow-up Brain MRI and intracranial MRA would confirm. 2. No malignant hemorrhagic transformation or intracranial mass effect. 3. Broad-based scalp hematoma without underlying skull fracture. 4. No acute fracture or listhesis in the cervical spine. 5. Extensive great vessel and carotid artery calcified atherosclerosis. Electronically Signed   By: Genevie Ann M.D.   On: 11/18/2016 12:11   Dg Chest Portable 1 View  Result Date: 11/28/2016 CLINICAL DATA:  Respiratory distress. EXAM: PORTABLE CHEST 1 VIEW COMPARISON:  11/24/2016 and chest CT 11/05/2016. FINDINGS: Patient slightly rotated to the right. Lungs are adequately inflated with minimal elevation of the left hemidiaphragm. There is mild opacification in the left base which is likely due to atelectasis mild stable prominence of the perihilar markings. Mild stable cardiomegaly. There is calcified plaque over the aortic arch. Remainder the exam is unchanged. IMPRESSION: New mild left base opacification likely due to atelectasis. Mild stable cardiomegaly with minimal vascular congestion unchanged. Aortic Atherosclerosis (ICD10-I70.0). Electronically Signed   By: Marin Olp M.D.   On: 11/22/2016 18:45   Dg Chest Port 1 View  Result Date: 11/06/2016 CLINICAL DATA:  Altered level of  consciousness and tachycardia EXAM: PORTABLE CHEST 1 VIEW COMPARISON:  None. FINDINGS: Cardiac shadow is enlarged. Aortic calcifications are noted. No focal infiltrate or sizable effusion is seen. No bony abnormality is noted. IMPRESSION: No active disease. Electronically Signed   By: Inez Catalina M.D.   On:  11/25/2016 11:39    EKG:   Orders placed or performed during the hospital encounter of 12/03/2016  . ED EKG  . ED EKG  . EKG 12-Lead  . EKG 12-Lead    IMPRESSION AND PLAN:  Principal Problem:   Stroke [facility]) - admit her stroke admission orders set with corresponding imaging, labs, consults including neurology consult Active Problems:   Severe sepsis ([place]) - broad-spectrum IV antibiotics, cultures sent, lactic acid within normal limits   Rhabdomyolysis - IV fluids, recheck CK in the morning   Rib fractures - patient is currently on nonrebreather, satting well   AKI (acute kidney injury) ([place]) - likely due to rhabdomyolysis, as well as possible UTI   Wound infection - contributing to his sepsis, see above   Atrial fibrillation with RVR ([place]) - controlled now until trip, elevated troponin likely due to demand ischemia but we will trend it, get an echocardiogram, and a cardiology consult   UTI - patient has what seems to be chronic left perinephric stranding on CT scan, but right-sided acute perinephric stranding indicating possible infection. Given his severe sepsis we will treat for UTI with antibiotics as above, culture sent    All the records are reviewed and case discussed with ED provider. Management plans discussed with the patient and/or family.  DVT PROPHYLAXIS: SubQ lovenox  GI PROPHYLAXIS: None  ADMISSION STATUS: Inpatient  CODE STATUS: Full Code Status History    This patient does not have a recorded code status. Please follow your organizational policy for patients in this situation.      TOTAL TIME TAKING CARE OF THIS PATIENT: 45 minutes.   Amnah Breuer  [place] 11/22/2016, 11:57 PM  [facility]  Office  [phone]  CC: Primary care physician; [place]  Note:  This document was prepared using Systems analyst and may include unintentional dictation errors.

## 2016-11-27 NOTE — ED Notes (Signed)
Continue to wait for cardiazem from pharmacy, multiple calls placed to pharmacy for medication.

## 2016-11-27 NOTE — ED Notes (Addendum)
Temp foley being placed.  Pads put on patient.

## 2016-11-27 NOTE — ED Notes (Signed)
Spoke with dr. Scotty Court regarding possible need for additional zosyn, ivf and pain medication.

## 2016-11-27 NOTE — ED Notes (Signed)
Out of magnesium in pyxis, waiting on pharmacy to send mag drip.

## 2016-11-27 NOTE — ED Notes (Signed)
Dr. Stafford in to reexamine.  

## 2016-11-27 NOTE — ED Notes (Signed)
Oral care performed, significant amount of dried yellow secretions removed from lips, tongue and upper palate.

## 2016-11-27 NOTE — ED Notes (Signed)
Dr. Scotty Court notified of continued tachycardia, order for cardiazem received.

## 2016-11-28 DIAGNOSIS — I4891 Unspecified atrial fibrillation: Secondary | ICD-10-CM

## 2016-11-28 DIAGNOSIS — I63512 Cerebral infarction due to unspecified occlusion or stenosis of left middle cerebral artery: Secondary | ICD-10-CM

## 2016-11-28 DIAGNOSIS — I639 Cerebral infarction, unspecified: Secondary | ICD-10-CM

## 2016-11-28 DIAGNOSIS — M6282 Rhabdomyolysis: Secondary | ICD-10-CM

## 2016-11-28 LAB — BLOOD CULTURE ID PANEL (REFLEXED)
ACINETOBACTER BAUMANNII: NOT DETECTED
CANDIDA ALBICANS: NOT DETECTED
CANDIDA GLABRATA: NOT DETECTED
CANDIDA KRUSEI: NOT DETECTED
Candida parapsilosis: NOT DETECTED
Candida tropicalis: NOT DETECTED
ENTEROBACTER CLOACAE COMPLEX: NOT DETECTED
ENTEROBACTERIACEAE SPECIES: NOT DETECTED
ENTEROCOCCUS SPECIES: NOT DETECTED
ESCHERICHIA COLI: NOT DETECTED
Haemophilus influenzae: NOT DETECTED
Klebsiella oxytoca: NOT DETECTED
Klebsiella pneumoniae: NOT DETECTED
LISTERIA MONOCYTOGENES: NOT DETECTED
Methicillin resistance: NOT DETECTED
NEISSERIA MENINGITIDIS: NOT DETECTED
PSEUDOMONAS AERUGINOSA: NOT DETECTED
Proteus species: NOT DETECTED
STREPTOCOCCUS AGALACTIAE: NOT DETECTED
STREPTOCOCCUS PNEUMONIAE: NOT DETECTED
STREPTOCOCCUS SPECIES: NOT DETECTED
Serratia marcescens: NOT DETECTED
Staphylococcus aureus (BCID): NOT DETECTED
Staphylococcus species: DETECTED — AB
Streptococcus pyogenes: NOT DETECTED

## 2016-11-28 LAB — BLOOD GAS, VENOUS
ACID-BASE EXCESS: 2 mmol/L (ref 0.0–2.0)
Bicarbonate: 27.8 mmol/L (ref 20.0–28.0)
FIO2: 0.32
PATIENT TEMPERATURE: 37
pCO2, Ven: 46 mmHg (ref 44.0–60.0)
pH, Ven: 7.39 (ref 7.250–7.430)
pO2, Ven: <31 mmHg — CL (ref 32.0–45.0)

## 2016-11-28 LAB — COMPREHENSIVE METABOLIC PANEL
ALK PHOS: 65 U/L (ref 38–126)
ALT: 25 U/L (ref 17–63)
AST: 48 U/L — ABNORMAL HIGH (ref 15–41)
Albumin: 3 g/dL — ABNORMAL LOW (ref 3.5–5.0)
Anion gap: 11 (ref 5–15)
BUN: 64 mg/dL — AB (ref 6–20)
CHLORIDE: 109 mmol/L (ref 101–111)
CO2: 24 mmol/L (ref 22–32)
CREATININE: 1.4 mg/dL — AB (ref 0.61–1.24)
Calcium: 8.2 mg/dL — ABNORMAL LOW (ref 8.9–10.3)
GFR calc Af Amer: 52 mL/min — ABNORMAL LOW (ref >60)
GFR calc non Af Amer: 45 mL/min — ABNORMAL LOW (ref >60)
GLUCOSE: 121 mg/dL — AB (ref 65–99)
POTASSIUM: 3.8 mmol/L (ref 3.5–5.1)
SODIUM: 144 mmol/L (ref 135–145)
Total Bilirubin: 2.6 mg/dL — ABNORMAL HIGH (ref 0.3–1.2)
Total Protein: 5.7 g/dL — ABNORMAL LOW (ref 6.5–8.1)

## 2016-11-28 LAB — PHOSPHORUS: Phosphorus: 4.9 mg/dL — ABNORMAL HIGH (ref 2.5–4.6)

## 2016-11-28 LAB — LACTIC ACID, PLASMA: Lactic Acid, Venous: 1.7 mmol/L (ref 0.5–1.9)

## 2016-11-28 LAB — HEMOGLOBIN A1C
HEMOGLOBIN A1C: 6 % — AB (ref 4.8–5.6)
Mean Plasma Glucose: 125.5 mg/dL

## 2016-11-28 LAB — CBC
HEMATOCRIT: 44.9 % (ref 40.0–52.0)
HEMOGLOBIN: 15.1 g/dL (ref 13.0–18.0)
MCH: 30 pg (ref 26.0–34.0)
MCHC: 33.7 g/dL (ref 32.0–36.0)
MCV: 88.9 fL (ref 80.0–100.0)
Platelets: 169 10*3/uL (ref 150–440)
RBC: 5.04 MIL/uL (ref 4.40–5.90)
RDW: 14.6 % — ABNORMAL HIGH (ref 11.5–14.5)
WBC: 6.5 10*3/uL (ref 3.8–10.6)

## 2016-11-28 LAB — LIPID PANEL
CHOL/HDL RATIO: 3.3 ratio
CHOLESTEROL: 146 mg/dL (ref 0–200)
HDL: 44 mg/dL (ref >40)
LDL Cholesterol: 81 mg/dL (ref 0–99)
TRIGLYCERIDES: 105 mg/dL (ref <150)
VLDL: 21 mg/dL (ref 0–40)

## 2016-11-28 LAB — PROCALCITONIN: Procalcitonin: 0.19 ng/mL

## 2016-11-28 LAB — MRSA PCR SCREENING: MRSA BY PCR: NEGATIVE

## 2016-11-28 LAB — TROPONIN I
TROPONIN I: 0.44 ng/mL — AB (ref <0.03)
Troponin I: 0.37 ng/mL (ref <0.03)

## 2016-11-28 LAB — CK: Total CK: 694 U/L — ABNORMAL HIGH (ref 49–397)

## 2016-11-28 LAB — PROTIME-INR
INR: 1.19
PROTHROMBIN TIME: 15 s (ref 11.4–15.2)

## 2016-11-28 LAB — MAGNESIUM: Magnesium: 2.6 mg/dL — ABNORMAL HIGH (ref 1.7–2.4)

## 2016-11-28 LAB — APTT: APTT: 27 s (ref 24–36)

## 2016-11-28 MED ORDER — ACETAMINOPHEN 160 MG/5ML PO SOLN
650.0000 mg | ORAL | Status: DC | PRN
  Filled 2016-11-28: qty 20.3

## 2016-11-28 MED ORDER — MORPHINE SULFATE (PF) 4 MG/ML IV SOLN
INTRAVENOUS | Status: AC
  Administered 2016-11-28: 2 mg
  Filled 2016-11-28: qty 1

## 2016-11-28 MED ORDER — MORPHINE SULFATE (PF) 2 MG/ML IV SOLN
2.0000 mg | INTRAVENOUS | Status: DC | PRN
  Administered 2016-11-28: 6 mg via INTRAVENOUS
  Administered 2016-11-28: 10 mg via INTRAVENOUS
  Administered 2016-11-28: 6 mg via INTRAVENOUS
  Administered 2016-11-28: 8 mg via INTRAVENOUS
  Filled 2016-11-28 (×2): qty 3
  Filled 2016-11-28: qty 4
  Filled 2016-11-28 (×2): qty 5

## 2016-11-28 MED ORDER — SODIUM CHLORIDE 0.9 % IV SOLN
INTRAVENOUS | Status: DC
  Administered 2016-11-28 (×2): via INTRAVENOUS

## 2016-11-28 MED ORDER — ACETAMINOPHEN 650 MG RE SUPP: 650.0000 mg | RECTAL | Status: DC | PRN

## 2016-11-28 MED ORDER — MORPHINE SULFATE (PF) 2 MG/ML IV SOLN
INTRAVENOUS | Status: AC
  Filled 2016-11-28: qty 1

## 2016-11-28 MED ORDER — GLYCOPYRROLATE 0.2 MG/ML IJ SOLN
0.2000 mg | INTRAMUSCULAR | Status: DC | PRN
  Filled 2016-11-28: qty 1

## 2016-11-28 MED ORDER — HYDRALAZINE HCL 20 MG/ML IJ SOLN: 10.0000 mg | Freq: Four times a day (QID) | INTRAMUSCULAR | Status: DC | PRN

## 2016-11-28 MED ORDER — MORPHINE SULFATE (PF) 2 MG/ML IV SOLN
2.0000 mg | INTRAVENOUS | Status: DC | PRN
  Administered 2016-11-28: 2 mg via INTRAVENOUS

## 2016-11-28 MED ORDER — IPRATROPIUM-ALBUTEROL 0.5-2.5 (3) MG/3ML IN SOLN
3.0000 mL | Freq: Four times a day (QID) | RESPIRATORY_TRACT | Status: DC
  Administered 2016-11-28 (×2): 3 mL via RESPIRATORY_TRACT
  Filled 2016-11-28: qty 3

## 2016-11-28 MED ORDER — STROKE: EARLY STAGES OF RECOVERY BOOK: Freq: Once | Status: DC

## 2016-11-28 MED ORDER — IPRATROPIUM-ALBUTEROL 0.5-2.5 (3) MG/3ML IN SOLN
RESPIRATORY_TRACT | Status: AC
  Administered 2016-11-28: 3 mL
  Filled 2016-11-28: qty 3

## 2016-11-28 MED ORDER — ENOXAPARIN SODIUM 40 MG/0.4ML ~~LOC~~ SOLN: 40.0000 mg | SUBCUTANEOUS | Status: DC

## 2016-11-28 MED ORDER — METOPROLOL TARTRATE 5 MG/5ML IV SOLN: 2.5000 mg | INTRAVENOUS | Status: DC | PRN

## 2016-11-28 MED ORDER — ORAL CARE MOUTH RINSE: 15.0000 mL | Freq: Two times a day (BID) | OROMUCOSAL | Status: DC

## 2016-11-28 MED ORDER — MORPHINE SULFATE (PF) 4 MG/ML IV SOLN
INTRAVENOUS | Status: AC
  Administered 2016-11-28: 4 mg
  Filled 2016-11-28: qty 1

## 2016-11-28 MED ORDER — ACETAMINOPHEN 325 MG PO TABS: 650.0000 mg | ORAL_TABLET | ORAL | Status: DC | PRN

## 2016-11-28 NOTE — Consult Note (Signed)
PULMONARY / CRITICAL CARE MEDICINE   Name: [facility]. MRN: [id number] DOB: [birth year]    ADMISSION DATE:  Dec 26, 2016   CONSULTATION DATE:  11/28/16  REFERRING MD:  Dr. Anne Hahn  REASON: Acute change in mental status  CHIEF COMPLAINT:  Found down   HISTORY OF PRESENT ILLNESS:   This is an 83 year old Caucasian male with a past medical history as indicated below who was found in his apartment on the floor by his nephew yesterday morning. History is obtained from ED records and from patient's nephew, as he is currently confused and has very limited speech. Patient's nephew states that he last spoke to patient on Friday morning. On Saturday morning he called and didn't get hold of him and so on Sunday morning after he called again and couldn't get hold of him, he decided to go and check on him. Upon arrival at the patient's home, the door was open and he could hear the patient mourning and groaning on the floor. Once he entered the apartment. He found the patient on the floor and there was blood everywhere, hence he called EMS. At the ED, his CT head showed a left MCA stroke with surrounding petechial hemorrhage,a troponin of 0.36 and a CK level of greater than 1000.patient was supposed to be transferred out for neurosurgical evaluation. However, neurosurgery at [facility] indicated that there were no further interventions that could be done for patient. He is also outside the TPA window. He is being admitted to the ICU here for further management. Patient goes to the [place] for routine care He remains aphasic, and  tachycardic  PAST MEDICAL HISTORY :  He  has a past medical history of Atrial fibrillation (HCC); CAD (coronary artery disease); HLD (hyperlipidemia); and HTN (hypertension).  PAST SURGICAL HISTORY: He  has a past surgical history that includes Fracture surgery.  Not on File  No current facility-administered medications on file prior to encounter.    No current  outpatient prescriptions on file prior to encounter.    FAMILY HISTORY:  His has no family status information on file.    SOCIAL HISTORY: He  reports that he has quit smoking. He does not have any smokeless tobacco history on file. He reports that he does not drink alcohol or use drugs.  REVIEW OF SYSTEMS:   Unable to obtain as patient is currently aphasic and confused  SUBJECTIVE:   VITAL SIGNS: BP 123/82   Pulse 97   Temp 98.6 F (37 C) (Axillary)   Resp (!) 27   SpO2 97%   HEMODYNAMICS:    VENTILATOR SETTINGS:    INTAKE / OUTPUT: I/O last 3 completed shifts: In: 59 [IV Piggyback:50] Out: -   PHYSICAL EXAMINATION: General:  Moderate respiratory distress Neuro: awake, not following commands, aphasic, moves left upper and lower extremities HEENT: small right scalp hematoma, PERRLA, oral mucosa dry, trachea midline, no JVD Cardiovascular:  Pulse irregular, mildly tachycardic, S1, S2, no murmur, regurg or gallop, +2 pulses, trace edema Lungs:  Increased work of breathing, bilateral breath sounds, diminished in the bases, no wheezing or rhonchi Abdomen:  Soft, normal bowel sounds in all 4 quadrants Musculoskeletal:  No deformities, no swelling Skin:  Multiple bruises, especially in the right upper extremity, a right forearm laceration  LABS:  BMET  Recent Labs Lab December 26, 2016 1104 11/28/16 0130  NA 143 144  K 4.4 3.8  CL 103 109  CO2 27 24  BUN 57* 64*  CREATININE 1.32* 1.40*  GLUCOSE 128* 121*    Electrolytes  Recent Labs Lab 11/09/2016 1104 11/28/16 0130  CALCIUM 9.6 8.2*  MG  --  2.6*  PHOS  --  4.9*    CBC  Recent Labs Lab 11/17/2016 1104 11/28/16 0130  WBC 9.8 6.5  HGB 17.8 15.1  HCT 52.8* 44.9  PLT 188 169    Coag's  Recent Labs Lab 11/11/2016 1104 11/28/16 0130  APTT  --  27  INR 1.10 1.19    Sepsis Markers  Recent Labs Lab 12/04/2016 1104 11/25/2016 1838 11/28/16 0130  LATICACIDVEN 1.8 1.4 1.7    ABG No results for  input(s): PHART, PCO2ART, PO2ART in the last 168 hours.  Liver Enzymes  Recent Labs Lab 11/25/2016 1104 11/28/16 0130  AST 55* 48*  ALT 33 25  ALKPHOS 91 65  BILITOT 3.6* 2.6*  ALBUMIN 4.0 3.0*    Cardiac Enzymes  Recent Labs Lab 11/16/2016 1104 11/19/2016 1838 11/28/16 0120  TROPONINI 0.36* 0.38* 0.44*    Glucose No results for input(s): GLUCAP in the last 168 hours.  Imaging Ct Abdomen Pelvis Wo Contrast  Result Date: 11/17/2016 CLINICAL DATA:  Patient found down with acute mental status change. EXAM: CT CHEST, ABDOMEN AND PELVIS WITHOUT CONTRAST TECHNIQUE: Multidetector CT imaging of the chest, abdomen and pelvis was performed following the standard protocol without IV contrast. COMPARISON:  None. FINDINGS: CT CHEST FINDINGS Cardiovascular: Air in the right jugular, right subclavian, and left brachiocephalic veins is likely due to recent IV placement or injection. Atherosclerotic changes are seen in the nonaneurysmal thoracic aorta and its branching vessels. Cardiomegaly is noted without effusion. The central pulmonary arteries are normal in caliber. Mediastinum/Nodes: No effusions. The thyroid and esophagus are normal. No adenopathy. No mediastinal air. Lungs/Pleura: Central airways are normal. No pneumothorax. Emphysematous changes seen in the lungs. No nodule, mass, or focal infiltrate. Musculoskeletal: There are nondisplaced fractures of the right third and fourth anterior ribs with no pneumothorax. CT ABDOMEN PELVIS FINDINGS Hepatobiliary: Probable tiny cyst in the liver on series 2, image 51, too small to characterize. Another seen on image 75 and perhaps another on image 67. No solid masses. The gallbladder is unremarkable. Pancreas: Unremarkable. No pancreatic ductal dilatation or surrounding inflammatory changes. Spleen: Normal in size without focal abnormality. Adrenals/Urinary Tract: An adenoma is seen in the right adrenal gland. The left adrenal gland is normal. Vascular  calcifications are associated with both kidneys. No renal stones or hydronephrosis. Significant perinephric stranding seen bilaterally, right greater than left. No ureterectasis or ureteral stones. The bladder is decompressed with a Foley catheter. Air in the pelvis is thought to be within decompressed bladder. Stomach/Bowel: The stomach and small bowel are normal. The colon is otherwise normal. The appendix is normal. Vascular/Lymphatic: Atherosclerotic changes are seen in the nonaneurysmal abdominal aorta, iliac vessels, and femoral vessels. No adenopathy. Reproductive: Prostate is unremarkable. Other: There is significant stranding and background both kidneys as above, particularly on the right. There is an elliptical collection of fluid between the right kidney and liver measuring 3.9 by 1.9 cm. This is favored to be due to the right renal stranding in is no underlying hepatic abnormality is identified. Musculoskeletal: Anterior wedging of L1 and L4 is age indeterminate with no acute fracture line seen. IMPRESSION: 1. Nondisplaced fractures of the right anterior third and fourth ribs with no pneumothorax. 2. Emphysematous changes in the lungs. Atherosclerotic changes in the thoracic aorta. 3. Significant perinephric stranding bilaterally, right greater than left. This is an age-indeterminate finding.  There is no associated hydronephrosis. I suspect the stranding on the left is chronic. However, the stranding on the right is much greater and an the underlying acute process is not excluded. A small fluid collection between the liver and right kidney is thought to be renal in origin. Renal injury or infection could result in these findings. Sequela of acute renal insufficiency is a possibility. Recommend clinical correlation and follow-up as clinically warranted. 4. Atherosclerotic changes in the abdominal aorta and branching vessels. 5. Age-indeterminate anterior wedging of L1 and L4. Aortic aneurysm NOS  (ICD10-I71.9). Electronically Signed   By: Gerome Sam III M.D   On: 11/11/2016 12:25   Dg Forearm Right  Result Date: 11/22/2016 CLINICAL DATA:  Male of unknown age with right forearm deformity, unable to obtain history from patient. Presumed trauma. EXAM: RIGHT FOREARM - 2 VIEW COMPARISON:  None. FINDINGS: Lateral and volar angulated deformity of the distal right radius metadiaphysis appears chronic and healed. There is an associated chronic appearing ulnar styloid fracture. No superimposed carpal bone dislocation identified, although there may be associated carpal degeneration. The underlying bone mineralization appears normal. Grossly normal alignment at the right elbow. Calcified peripheral vascular disease in the right forearm. IMPRESSION: 1. Chronic and healed but abnormally angulated distal right radius fracture. Associated chronic ulnar styloid fracture. 2.  No acute osseous abnormality identified. 3. Calcified peripheral vascular disease. Electronically Signed   By: Odessa Fleming M.D.   On: 12/01/2016 11:41   Ct Head Wo Contrast  Result Date: 11/16/2016 CLINICAL DATA:  Male of unknown age found down after not being heard from in a few days. Found covered in blood and feces. EXAM: CT HEAD WITHOUT CONTRAST CT CERVICAL SPINE WITHOUT CONTRAST TECHNIQUE: Multidetector CT imaging of the head and cervical spine was performed following the standard protocol without intravenous contrast. Multiplanar CT image reconstructions of the cervical spine were also generated. COMPARISON:  None. FINDINGS: CT HEAD FINDINGS Brain: There is an oval 4 x 6 cm area of hypodensity involving both gray and white matter in the anterior left MCA territory affecting the insula and frontal operculum. Lesser involvement of the anterior left temporal lobe. Heterogeneous density within the affected area is compatible with petechial hemorrhage (coronal image 22). In the left sylvian fissure there is a hyperdense vessel (coronal image  28). There is no significant mass effect. No intracranial midline shift. Patent left lateral ventricle. No ventriculomegaly. No other intracranial hemorrhage identified. Gray-white matter differentiation elsewhere appears within normal limits. Vascular: Calcified atherosclerosis at the skull base. Hyperdense vessel suspected in the left sylvian fissure as stated above. Skull: Osteopenia.  No skull fracture identified. Sinuses/Orbits: Mild anterior ethmoid and mild to moderate right sphenoid sinus mucosal thickening. Other visible paranasal sinuses and mastoids are well pneumatized. Other: Visualized orbit soft tissues are within normal limits. There is some calcified scalp vessel atherosclerosis. There is a mild broad-based vertex and right greater than left posterior convexity scalp hematoma. CT CERVICAL SPINE FINDINGS Alignment: Mildly exaggerated cervical lordosis. Bilateral posterior element alignment is within normal limits. Cervicothoracic junction alignment is within normal limits. Skull base and vertebrae: Osteopenia. Visualized skull base is intact. No atlanto-occipital dissociation. No cervical spine fracture identified. Soft tissues and spinal canal: No prevertebral fluid or swelling. No visible canal hematoma. Extensive bilateral carotid calcified atherosclerosis. Small volume retained secretions in the pharynx. Disc levels: Degenerative changes primarily at the anterior C1-odontoid, including joint space loss osteophytosis and subchondral cysts. Upper chest: Visible upper thoracic levels appear grossly intact. Centrilobular  emphysema in the lung apices. Extensive calcified proximal great vessels. IMPRESSION: 1. Moderate-sized area of abnormal mixed density in the left hemisphere centered at the insula and operculum most resembles a subacute anterior division Left MCA infarct with petechial hemorrhage, with evidence of a hyperdense vessel in the left sylvian fissure. Follow-up Brain MRI and intracranial  MRA would confirm. 2. No malignant hemorrhagic transformation or intracranial mass effect. 3. Broad-based scalp hematoma without underlying skull fracture. 4. No acute fracture or listhesis in the cervical spine. 5. Extensive great vessel and carotid artery calcified atherosclerosis. Electronically Signed   By: Odessa Fleming M.D.   On: 12-17-16 12:11   Ct Chest Wo Contrast  Result Date: 12-17-16 CLINICAL DATA:  Patient found down with acute mental status change. EXAM: CT CHEST, ABDOMEN AND PELVIS WITHOUT CONTRAST TECHNIQUE: Multidetector CT imaging of the chest, abdomen and pelvis was performed following the standard protocol without IV contrast. COMPARISON:  None. FINDINGS: CT CHEST FINDINGS Cardiovascular: Air in the right jugular, right subclavian, and left brachiocephalic veins is likely due to recent IV placement or injection. Atherosclerotic changes are seen in the nonaneurysmal thoracic aorta and its branching vessels. Cardiomegaly is noted without effusion. The central pulmonary arteries are normal in caliber. Mediastinum/Nodes: No effusions. The thyroid and esophagus are normal. No adenopathy. No mediastinal air. Lungs/Pleura: Central airways are normal. No pneumothorax. Emphysematous changes seen in the lungs. No nodule, mass, or focal infiltrate. Musculoskeletal: There are nondisplaced fractures of the right third and fourth anterior ribs with no pneumothorax. CT ABDOMEN PELVIS FINDINGS Hepatobiliary: Probable tiny cyst in the liver on series 2, image 51, too small to characterize. Another seen on image 22 and perhaps another on image 67. No solid masses. The gallbladder is unremarkable. Pancreas: Unremarkable. No pancreatic ductal dilatation or surrounding inflammatory changes. Spleen: Normal in size without focal abnormality. Adrenals/Urinary Tract: An adenoma is seen in the right adrenal gland. The left adrenal gland is normal. Vascular calcifications are associated with both kidneys. No renal stones  or hydronephrosis. Significant perinephric stranding seen bilaterally, right greater than left. No ureterectasis or ureteral stones. The bladder is decompressed with a Foley catheter. Air in the pelvis is thought to be within decompressed bladder. Stomach/Bowel: The stomach and small bowel are normal. The colon is otherwise normal. The appendix is normal. Vascular/Lymphatic: Atherosclerotic changes are seen in the nonaneurysmal abdominal aorta, iliac vessels, and femoral vessels. No adenopathy. Reproductive: Prostate is unremarkable. Other: There is significant stranding and background both kidneys as above, particularly on the right. There is an elliptical collection of fluid between the right kidney and liver measuring 3.9 by 1.9 cm. This is favored to be due to the right renal stranding in is no underlying hepatic abnormality is identified. Musculoskeletal: Anterior wedging of L1 and L4 is age indeterminate with no acute fracture line seen. IMPRESSION: 1. Nondisplaced fractures of the right anterior third and fourth ribs with no pneumothorax. 2. Emphysematous changes in the lungs. Atherosclerotic changes in the thoracic aorta. 3. Significant perinephric stranding bilaterally, right greater than left. This is an age-indeterminate finding. There is no associated hydronephrosis. I suspect the stranding on the left is chronic. However, the stranding on the right is much greater and an the underlying acute process is not excluded. A small fluid collection between the liver and right kidney is thought to be renal in origin. Renal injury or infection could result in these findings. Sequela of acute renal insufficiency is a possibility. Recommend clinical correlation and follow-up as clinically warranted.  4. Atherosclerotic changes in the abdominal aorta and branching vessels. 5. Age-indeterminate anterior wedging of L1 and L4. Aortic aneurysm NOS (ICD10-I71.9). Electronically Signed   By: Gerome Sam III M.D   On:  11/08/2016 12:25   Ct Cervical Spine Wo Contrast  Result Date: 11/11/2016 CLINICAL DATA:  Male of unknown age found down after not being heard from in a few days. Found covered in blood and feces. EXAM: CT HEAD WITHOUT CONTRAST CT CERVICAL SPINE WITHOUT CONTRAST TECHNIQUE: Multidetector CT imaging of the head and cervical spine was performed following the standard protocol without intravenous contrast. Multiplanar CT image reconstructions of the cervical spine were also generated. COMPARISON:  None. FINDINGS: CT HEAD FINDINGS Brain: There is an oval 4 x 6 cm area of hypodensity involving both gray and white matter in the anterior left MCA territory affecting the insula and frontal operculum. Lesser involvement of the anterior left temporal lobe. Heterogeneous density within the affected area is compatible with petechial hemorrhage (coronal image 22). In the left sylvian fissure there is a hyperdense vessel (coronal image 28). There is no significant mass effect. No intracranial midline shift. Patent left lateral ventricle. No ventriculomegaly. No other intracranial hemorrhage identified. Gray-white matter differentiation elsewhere appears within normal limits. Vascular: Calcified atherosclerosis at the skull base. Hyperdense vessel suspected in the left sylvian fissure as stated above. Skull: Osteopenia.  No skull fracture identified. Sinuses/Orbits: Mild anterior ethmoid and mild to moderate right sphenoid sinus mucosal thickening. Other visible paranasal sinuses and mastoids are well pneumatized. Other: Visualized orbit soft tissues are within normal limits. There is some calcified scalp vessel atherosclerosis. There is a mild broad-based vertex and right greater than left posterior convexity scalp hematoma. CT CERVICAL SPINE FINDINGS Alignment: Mildly exaggerated cervical lordosis. Bilateral posterior element alignment is within normal limits. Cervicothoracic junction alignment is within normal limits. Skull  base and vertebrae: Osteopenia. Visualized skull base is intact. No atlanto-occipital dissociation. No cervical spine fracture identified. Soft tissues and spinal canal: No prevertebral fluid or swelling. No visible canal hematoma. Extensive bilateral carotid calcified atherosclerosis. Small volume retained secretions in the pharynx. Disc levels: Degenerative changes primarily at the anterior C1-odontoid, including joint space loss osteophytosis and subchondral cysts. Upper chest: Visible upper thoracic levels appear grossly intact. Centrilobular emphysema in the lung apices. Extensive calcified proximal great vessels. IMPRESSION: 1. Moderate-sized area of abnormal mixed density in the left hemisphere centered at the insula and operculum most resembles a subacute anterior division Left MCA infarct with petechial hemorrhage, with evidence of a hyperdense vessel in the left sylvian fissure. Follow-up Brain MRI and intracranial MRA would confirm. 2. No malignant hemorrhagic transformation or intracranial mass effect. 3. Broad-based scalp hematoma without underlying skull fracture. 4. No acute fracture or listhesis in the cervical spine. 5. Extensive great vessel and carotid artery calcified atherosclerosis. Electronically Signed   By: Odessa Fleming M.D.   On: 11/25/2016 12:11   Dg Chest Portable 1 View  Result Date: 11/26/2016 CLINICAL DATA:  Respiratory distress. EXAM: PORTABLE CHEST 1 VIEW COMPARISON:  11/25/2016 and chest CT 11/15/2016. FINDINGS: Patient slightly rotated to the right. Lungs are adequately inflated with minimal elevation of the left hemidiaphragm. There is mild opacification in the left base which is likely due to atelectasis mild stable prominence of the perihilar markings. Mild stable cardiomegaly. There is calcified plaque over the aortic arch. Remainder the exam is unchanged. IMPRESSION: New mild left base opacification likely due to atelectasis. Mild stable cardiomegaly with minimal vascular  congestion unchanged. Aortic  Atherosclerosis (ICD10-I70.0). Electronically Signed   By: Elberta Fortis M.D.   On: 11/19/2016 18:45   Dg Chest Port 1 View  Result Date: 11/21/2016 CLINICAL DATA:  Altered level of consciousness and tachycardia EXAM: PORTABLE CHEST 1 VIEW COMPARISON:  None. FINDINGS: Cardiac shadow is enlarged. Aortic calcifications are noted. No focal infiltrate or sizable effusion is seen. No bony abnormality is noted. IMPRESSION: No active disease. Electronically Signed   By: Alcide Clever M.D.   On: 11/10/2016 11:39   STUDIES:  none  CULTURES: Blood cultures x 2 Urine culture  ANTIBIOTICS: Vancomycin Zosyn  SIGNIFICANT EVENTS: 09/23>ADMITTED  LINES/TUBES: PIVs Foley  DISCUSSION: 83 Y/O male admitted with a left MCA infarct with surrounding petechial hemorrhage, AKI and acute hypoxic respiratory failure. Over all prognosis poor. No intervention per neurosurgery. Patient presented outside tPA window.  ASSESSMENT Left MCA infarct with surrounding petechial hemorrhage Right non-displaced rib fractures-3rd and 4th anterior ribs AKI Mildly elevated troponin-likely demand ischemia Afib with RVR-HR in the 110s and 120s Acute hypoxemic respiratory failure  Rhabdomyolysis   PLAN Hemodynamics per ICU protocol IV fluids to maintain MAP>65 Diltiazem gtte Morphine prn for pain and dyspnea Cycle cardiac enzymes Supplemental O2 prn to maintain SPO2>90 and for comfort PRN hydralazine for SBP>160 Metoprolol IV prn for HR>120 NPO Trend CK Swallow evaluation Trend pro-calcitonin and Discontinue antibiotics if needed Palliative care consult for hospice. Wound care for multiple skin lacerations. GI prophylaxis-SCDs for DVT prophylaxis  FAMILY  - Updates: I discussed at length with his nephew with Susa Day regarding his current diagnosis and overall prognosis. He wants patient comfortable without any heroic resuscitation measures. Patient has been made DNR/DNI.  Patient has a daughter who lives in [place] but he has assigned his nephew to be in charge of all his estate and medical decisions. A copy of his will is in his chart    - Inter-disciplinary family meet or Palliative Care meeting due by:  day 7   Lamir Racca S. [facility] ANP-BC Pulmonary and Critical Care Medicine [facility] Pager [phone] or [phone]  11/28/2016, 2:33 AM

## 2016-11-28 NOTE — Progress Notes (Addendum)
eLink Physician-Brief Progress Note Patient Name: [facility]. DOB: June 16, 1930 MRN: [id number]   Date of Service  11/28/2016  HPI/Events of Note  83 yo male found down. Clinical exam c/w L MCA distribution CVA. AFIB with RVR on Cardizem IV infusion. Rhabdomyolysis.Currently on NRBM and video assessment reveals airway to be marginal. RR = 35-45.I called the  PCCM APP who is aware of the patient's respiratory status and is trying to address GOC prior to intubating the patient. Defer further management to on-site PCCM team.   eICU Interventions  No new orders. PCCM team currently at bedside      Intervention Category Evaluation Type: New Patient Evaluation  Samara Stankowski Dennard Nip 11/28/2016, 2:26 AM

## 2016-11-28 NOTE — Progress Notes (Signed)
After multiple attempts patient's daughter, [place], reached to be updated on patient's condition. Patient's Nephew Kuron Docken had already spoken to daughter and daughter agrees with the plan to not do "heroic resuscitation measures" and "keep patient comfortable".

## 2016-11-28 NOTE — Progress Notes (Signed)
   11/28/16 1100  Clinical Encounter Type  Visited With Patient;Health care provider  Visit Type Initial;Critical Care;Patient actively dying  Referral From Physician  Spiritual Encounters  Spiritual Needs Emotional;Grief support   [facility] visited patient room; no family present; patient placed on comfort care; CH will follow up later today.

## 2016-11-28 NOTE — Progress Notes (Signed)
OT Cancellation Note  Patient Details Name: Kyle Vega. MRN: [id number] DOB: 06-Oct-1930   Cancelled Treatment:    Reason Eval/Treat Not Completed: Medical issues which prohibited therapy. Consult received, chart reviewed.  Noted patient transition to comfort care.  Will discontinue OT orders at this time.  Please re-consult should needs/goals of care change.  Richrd Prime, MPH, MS, OTR/L ascom [phone] 11/28/16, 10:20 AM

## 2016-11-28 NOTE — Care Management (Signed)
Per MD patient is comfort care and will not be transferring to [place].

## 2016-11-28 NOTE — Clinical Social Work Note (Signed)
CSW was consulted in order to notify CSW that patient is now comfort care. York Spaniel MSW,LCSW [phone]

## 2016-11-28 NOTE — Progress Notes (Signed)
Chaplain was making rounds and visited with pt in room ICU18. Chaplain provided the ministry of prayer and a spiritual presence. Chaplain is available for follow up as needed.     11/28/16 1325  Clinical Encounter Type  Visited With Patient  Visit Type Initial;Spiritual support  Referral From Nurse  Consult/Referral To Chaplain  Spiritual Encounters  Spiritual Needs Prayer

## 2016-11-28 NOTE — Progress Notes (Signed)
[employer] [facility] [place] [facility]   PATIENT NAME: Kyle Vega    MR#:  [id number]  DATE OF BIRTH:  01/22/1931  SUBJECTIVE:   Patient here after being found unresponsive and noted to have a left MCA CVA with some hemorrhage. Also noted to be acute rhabdomyolysis with sepsis. Patient critically ill and with worsening resp. Failure earlier and Intensivist discussed with pt's POA who has agreed to make pt. COMFORT CARE ONLY.   REVIEW OF SYSTEMS:    Review of Systems  Unable to perform ROS: Mental acuity    Nutrition: None  Tolerating Diet: No  DRUG ALLERGIES:   Allergies  Allergen Reactions  . Other     Contrast dye    VITALS:  Blood pressure 123/71, pulse 82, temperature 98.6 F (37 C), temperature source Axillary, resp. rate (!) 32, SpO2 98 %.  PHYSICAL EXAMINATION:   Physical Exam  GENERAL:  83 y.o.-year-old patient lying in bed encephalopathic/letargic  EYES: Pupils equal, round, reactive to light. No scleral icterus.  HEENT: Bruise on right forehead normocephalic. Oropharynx and nasopharynx clear.  NECK:  Supple, no jugular venous distention. No thyroid enlargement, no tenderness.  LUNGS: Normal breath sounds bilaterally, no wheezing, rales, rhonchi. No use of accessory muscles of respiration.  CARDIOVASCULAR: S1, S2 normal. No murmurs, rubs, or gallops.  ABDOMEN: Soft, nontender, nondistended. Bowel sounds present. No organomegaly or mass.  EXTREMITIES: No cyanosis, clubbing or edema b/l.    NEUROLOGIC: Cranial nerves II through XII are intact. No focal Motor or sensory deficits b/l. Globally weak/encephalpathic.   PSYCHIATRIC: Lethargic/Encephalopathic.  SKIN: No obvious rash, lesion, or ulcer.    LABORATORY PANEL:   CBC  Recent Labs Lab 11/28/16 0130  WBC 6.5  HGB 15.1  HCT 44.9  PLT 169   ------------------------------------------------------------------------------------------------------------------  Chemistries   Recent  Labs Lab 11/28/16 0130  NA 144  K 3.8  CL 109  CO2 24  GLUCOSE 121*  BUN 64*  CREATININE 1.40*  CALCIUM 8.2*  MG 2.6*  AST 48*  ALT 25  ALKPHOS 65  BILITOT 2.6*   ------------------------------------------------------------------------------------------------------------------  Cardiac Enzymes  Recent Labs Lab 11/28/16 0704  TROPONINI 0.37*   ------------------------------------------------------------------------------------------------------------------  RADIOLOGY:  Ct Abdomen Pelvis Wo Contrast  Result Date: December 09, 2016 CLINICAL DATA:  Patient found down with acute mental status change. EXAM: CT CHEST, ABDOMEN AND PELVIS WITHOUT CONTRAST TECHNIQUE: Multidetector CT imaging of the chest, abdomen and pelvis was performed following the standard protocol without IV contrast. COMPARISON:  None. FINDINGS: CT CHEST FINDINGS Cardiovascular: Air in the right jugular, right subclavian, and left brachiocephalic veins is likely due to recent IV placement or injection. Atherosclerotic changes are seen in the nonaneurysmal thoracic aorta and its branching vessels. Cardiomegaly is noted without effusion. The central pulmonary arteries are normal in caliber. Mediastinum/Nodes: No effusions. The thyroid and esophagus are normal. No adenopathy. No mediastinal air. Lungs/Pleura: Central airways are normal. No pneumothorax. Emphysematous changes seen in the lungs. No nodule, mass, or focal infiltrate. Musculoskeletal: There are nondisplaced fractures of the right third and fourth anterior ribs with no pneumothorax. CT ABDOMEN PELVIS FINDINGS Hepatobiliary: Probable tiny cyst in the liver on series 2, image 51, too small to characterize. Another seen on image 16 and perhaps another on image 67. No solid masses. The gallbladder is unremarkable. Pancreas: Unremarkable. No pancreatic ductal dilatation or surrounding inflammatory changes. Spleen: Normal in size without focal abnormality. Adrenals/Urinary  Tract: An adenoma is seen in the right adrenal gland. The left adrenal gland  is normal. Vascular calcifications are associated with both kidneys. No renal stones or hydronephrosis. Significant perinephric stranding seen bilaterally, right greater than left. No ureterectasis or ureteral stones. The bladder is decompressed with a Foley catheter. Air in the pelvis is thought to be within decompressed bladder. Stomach/Bowel: The stomach and small bowel are normal. The colon is otherwise normal. The appendix is normal. Vascular/Lymphatic: Atherosclerotic changes are seen in the nonaneurysmal abdominal aorta, iliac vessels, and femoral vessels. No adenopathy. Reproductive: Prostate is unremarkable. Other: There is significant stranding and background both kidneys as above, particularly on the right. There is an elliptical collection of fluid between the right kidney and liver measuring 3.9 by 1.9 cm. This is favored to be due to the right renal stranding in is no underlying hepatic abnormality is identified. Musculoskeletal: Anterior wedging of L1 and L4 is age indeterminate with no acute fracture line seen. IMPRESSION: 1. Nondisplaced fractures of the right anterior third and fourth ribs with no pneumothorax. 2. Emphysematous changes in the lungs. Atherosclerotic changes in the thoracic aorta. 3. Significant perinephric stranding bilaterally, right greater than left. This is an age-indeterminate finding. There is no associated hydronephrosis. I suspect the stranding on the left is chronic. However, the stranding on the right is much greater and an the underlying acute process is not excluded. A small fluid collection between the liver and right kidney is thought to be renal in origin. Renal injury or infection could result in these findings. Sequela of acute renal insufficiency is a possibility. Recommend clinical correlation and follow-up as clinically warranted. 4. Atherosclerotic changes in the abdominal aorta and  branching vessels. 5. Age-indeterminate anterior wedging of L1 and L4. Aortic aneurysm NOS (ICD10-I71.9). Electronically Signed   By: Gerome Sam III M.D   On: 11/18/2016 12:25   Dg Forearm Right  Result Date: 12/03/2016 CLINICAL DATA:  Male of unknown age with right forearm deformity, unable to obtain history from patient. Presumed trauma. EXAM: RIGHT FOREARM - 2 VIEW COMPARISON:  None. FINDINGS: Lateral and volar angulated deformity of the distal right radius metadiaphysis appears chronic and healed. There is an associated chronic appearing ulnar styloid fracture. No superimposed carpal bone dislocation identified, although there may be associated carpal degeneration. The underlying bone mineralization appears normal. Grossly normal alignment at the right elbow. Calcified peripheral vascular disease in the right forearm. IMPRESSION: 1. Chronic and healed but abnormally angulated distal right radius fracture. Associated chronic ulnar styloid fracture. 2.  No acute osseous abnormality identified. 3. Calcified peripheral vascular disease. Electronically Signed   By: Odessa Fleming M.D.   On: 11/15/2016 11:41   Ct Head Wo Contrast  Result Date: 11/12/2016 CLINICAL DATA:  Male of unknown age found down after not being heard from in a few days. Found covered in blood and feces. EXAM: CT HEAD WITHOUT CONTRAST CT CERVICAL SPINE WITHOUT CONTRAST TECHNIQUE: Multidetector CT imaging of the head and cervical spine was performed following the standard protocol without intravenous contrast. Multiplanar CT image reconstructions of the cervical spine were also generated. COMPARISON:  None. FINDINGS: CT HEAD FINDINGS Brain: There is an oval 4 x 6 cm area of hypodensity involving both gray and white matter in the anterior left MCA territory affecting the insula and frontal operculum. Lesser involvement of the anterior left temporal lobe. Heterogeneous density within the affected area is compatible with petechial hemorrhage  (coronal image 22). In the left sylvian fissure there is a hyperdense vessel (coronal image 28). There is no significant mass effect. No intracranial  midline shift. Patent left lateral ventricle. No ventriculomegaly. No other intracranial hemorrhage identified. Gray-white matter differentiation elsewhere appears within normal limits. Vascular: Calcified atherosclerosis at the skull base. Hyperdense vessel suspected in the left sylvian fissure as stated above. Skull: Osteopenia.  No skull fracture identified. Sinuses/Orbits: Mild anterior ethmoid and mild to moderate right sphenoid sinus mucosal thickening. Other visible paranasal sinuses and mastoids are well pneumatized. Other: Visualized orbit soft tissues are within normal limits. There is some calcified scalp vessel atherosclerosis. There is a mild broad-based vertex and right greater than left posterior convexity scalp hematoma. CT CERVICAL SPINE FINDINGS Alignment: Mildly exaggerated cervical lordosis. Bilateral posterior element alignment is within normal limits. Cervicothoracic junction alignment is within normal limits. Skull base and vertebrae: Osteopenia. Visualized skull base is intact. No atlanto-occipital dissociation. No cervical spine fracture identified. Soft tissues and spinal canal: No prevertebral fluid or swelling. No visible canal hematoma. Extensive bilateral carotid calcified atherosclerosis. Small volume retained secretions in the pharynx. Disc levels: Degenerative changes primarily at the anterior C1-odontoid, including joint space loss osteophytosis and subchondral cysts. Upper chest: Visible upper thoracic levels appear grossly intact. Centrilobular emphysema in the lung apices. Extensive calcified proximal great vessels. IMPRESSION: 1. Moderate-sized area of abnormal mixed density in the left hemisphere centered at the insula and operculum most resembles a subacute anterior division Left MCA infarct with petechial hemorrhage, with  evidence of a hyperdense vessel in the left sylvian fissure. Follow-up Brain MRI and intracranial MRA would confirm. 2. No malignant hemorrhagic transformation or intracranial mass effect. 3. Broad-based scalp hematoma without underlying skull fracture. 4. No acute fracture or listhesis in the cervical spine. 5. Extensive great vessel and carotid artery calcified atherosclerosis. Electronically Signed   By: Odessa Fleming M.D.   On: 2016-12-01 12:11   Ct Chest Wo Contrast  Result Date: 12-01-2016 CLINICAL DATA:  Patient found down with acute mental status change. EXAM: CT CHEST, ABDOMEN AND PELVIS WITHOUT CONTRAST TECHNIQUE: Multidetector CT imaging of the chest, abdomen and pelvis was performed following the standard protocol without IV contrast. COMPARISON:  None. FINDINGS: CT CHEST FINDINGS Cardiovascular: Air in the right jugular, right subclavian, and left brachiocephalic veins is likely due to recent IV placement or injection. Atherosclerotic changes are seen in the nonaneurysmal thoracic aorta and its branching vessels. Cardiomegaly is noted without effusion. The central pulmonary arteries are normal in caliber. Mediastinum/Nodes: No effusions. The thyroid and esophagus are normal. No adenopathy. No mediastinal air. Lungs/Pleura: Central airways are normal. No pneumothorax. Emphysematous changes seen in the lungs. No nodule, mass, or focal infiltrate. Musculoskeletal: There are nondisplaced fractures of the right third and fourth anterior ribs with no pneumothorax. CT ABDOMEN PELVIS FINDINGS Hepatobiliary: Probable tiny cyst in the liver on series 2, image 51, too small to characterize. Another seen on image 30 and perhaps another on image 67. No solid masses. The gallbladder is unremarkable. Pancreas: Unremarkable. No pancreatic ductal dilatation or surrounding inflammatory changes. Spleen: Normal in size without focal abnormality. Adrenals/Urinary Tract: An adenoma is seen in the right adrenal gland. The left  adrenal gland is normal. Vascular calcifications are associated with both kidneys. No renal stones or hydronephrosis. Significant perinephric stranding seen bilaterally, right greater than left. No ureterectasis or ureteral stones. The bladder is decompressed with a Foley catheter. Air in the pelvis is thought to be within decompressed bladder. Stomach/Bowel: The stomach and small bowel are normal. The colon is otherwise normal. The appendix is normal. Vascular/Lymphatic: Atherosclerotic changes are seen in the nonaneurysmal abdominal aorta,  iliac vessels, and femoral vessels. No adenopathy. Reproductive: Prostate is unremarkable. Other: There is significant stranding and background both kidneys as above, particularly on the right. There is an elliptical collection of fluid between the right kidney and liver measuring 3.9 by 1.9 cm. This is favored to be due to the right renal stranding in is no underlying hepatic abnormality is identified. Musculoskeletal: Anterior wedging of L1 and L4 is age indeterminate with no acute fracture line seen. IMPRESSION: 1. Nondisplaced fractures of the right anterior third and fourth ribs with no pneumothorax. 2. Emphysematous changes in the lungs. Atherosclerotic changes in the thoracic aorta. 3. Significant perinephric stranding bilaterally, right greater than left. This is an age-indeterminate finding. There is no associated hydronephrosis. I suspect the stranding on the left is chronic. However, the stranding on the right is much greater and an the underlying acute process is not excluded. A small fluid collection between the liver and right kidney is thought to be renal in origin. Renal injury or infection could result in these findings. Sequela of acute renal insufficiency is a possibility. Recommend clinical correlation and follow-up as clinically warranted. 4. Atherosclerotic changes in the abdominal aorta and branching vessels. 5. Age-indeterminate anterior wedging of L1 and  L4. Aortic aneurysm NOS (ICD10-I71.9). Electronically Signed   By: Gerome Sam III M.D   On: 11/14/2016 12:25   Ct Cervical Spine Wo Contrast  Result Date: 11/11/2016 CLINICAL DATA:  Male of unknown age found down after not being heard from in a few days. Found covered in blood and feces. EXAM: CT HEAD WITHOUT CONTRAST CT CERVICAL SPINE WITHOUT CONTRAST TECHNIQUE: Multidetector CT imaging of the head and cervical spine was performed following the standard protocol without intravenous contrast. Multiplanar CT image reconstructions of the cervical spine were also generated. COMPARISON:  None. FINDINGS: CT HEAD FINDINGS Brain: There is an oval 4 x 6 cm area of hypodensity involving both gray and white matter in the anterior left MCA territory affecting the insula and frontal operculum. Lesser involvement of the anterior left temporal lobe. Heterogeneous density within the affected area is compatible with petechial hemorrhage (coronal image 22). In the left sylvian fissure there is a hyperdense vessel (coronal image 28). There is no significant mass effect. No intracranial midline shift. Patent left lateral ventricle. No ventriculomegaly. No other intracranial hemorrhage identified. Gray-white matter differentiation elsewhere appears within normal limits. Vascular: Calcified atherosclerosis at the skull base. Hyperdense vessel suspected in the left sylvian fissure as stated above. Skull: Osteopenia.  No skull fracture identified. Sinuses/Orbits: Mild anterior ethmoid and mild to moderate right sphenoid sinus mucosal thickening. Other visible paranasal sinuses and mastoids are well pneumatized. Other: Visualized orbit soft tissues are within normal limits. There is some calcified scalp vessel atherosclerosis. There is a mild broad-based vertex and right greater than left posterior convexity scalp hematoma. CT CERVICAL SPINE FINDINGS Alignment: Mildly exaggerated cervical lordosis. Bilateral posterior element  alignment is within normal limits. Cervicothoracic junction alignment is within normal limits. Skull base and vertebrae: Osteopenia. Visualized skull base is intact. No atlanto-occipital dissociation. No cervical spine fracture identified. Soft tissues and spinal canal: No prevertebral fluid or swelling. No visible canal hematoma. Extensive bilateral carotid calcified atherosclerosis. Small volume retained secretions in the pharynx. Disc levels: Degenerative changes primarily at the anterior C1-odontoid, including joint space loss osteophytosis and subchondral cysts. Upper chest: Visible upper thoracic levels appear grossly intact. Centrilobular emphysema in the lung apices. Extensive calcified proximal great vessels. IMPRESSION: 1. Moderate-sized area of abnormal mixed density  in the left hemisphere centered at the insula and operculum most resembles a subacute anterior division Left MCA infarct with petechial hemorrhage, with evidence of a hyperdense vessel in the left sylvian fissure. Follow-up Brain MRI and intracranial MRA would confirm. 2. No malignant hemorrhagic transformation or intracranial mass effect. 3. Broad-based scalp hematoma without underlying skull fracture. 4. No acute fracture or listhesis in the cervical spine. 5. Extensive great vessel and carotid artery calcified atherosclerosis. Electronically Signed   By: Odessa Fleming M.D.   On: 11/18/2016 12:11   Dg Chest Portable 1 View  Result Date: 11/09/2016 CLINICAL DATA:  Respiratory distress. EXAM: PORTABLE CHEST 1 VIEW COMPARISON:  11/20/2016 and chest CT 11/18/2016. FINDINGS: Patient slightly rotated to the right. Lungs are adequately inflated with minimal elevation of the left hemidiaphragm. There is mild opacification in the left base which is likely due to atelectasis mild stable prominence of the perihilar markings. Mild stable cardiomegaly. There is calcified plaque over the aortic arch. Remainder the exam is unchanged. IMPRESSION: New mild  left base opacification likely due to atelectasis. Mild stable cardiomegaly with minimal vascular congestion unchanged. Aortic Atherosclerosis (ICD10-I70.0). Electronically Signed   By: Elberta Fortis M.D.   On: 11/19/2016 18:45   Dg Chest Port 1 View  Result Date: 11/11/2016 CLINICAL DATA:  Altered level of consciousness and tachycardia EXAM: PORTABLE CHEST 1 VIEW COMPARISON:  None. FINDINGS: Cardiac shadow is enlarged. Aortic calcifications are noted. No focal infiltrate or sizable effusion is seen. No bony abnormality is noted. IMPRESSION: No active disease. Electronically Signed   By: Alcide Clever M.D.   On: 11/13/2016 11:39     ASSESSMENT AND PLAN:   83 year old male with past medical history of hypertension, hyperlipidemia, history of coronary artery atrial fibrillation who presents to the hospital as he was found unresponsive noted to have a left MCA stroke.  1. Acute Left MCA CVA 2. Altered Mental status due to # 1.  3. Acute Rhabdomyolysis.  4. Acute Renal Failure.  5. Elevated Troponin.  6. A. Fib w/ RVR 7. Right sided Rib Fractures.   Pt's mental status was not improving and he was noted to have airway compromise due to his CVA.  Intensivist discussed with pt's POA about pt's goals of care and they did not want to pursue aggressive care and pt. Was made COMFORT CARE ONLY.  - cont. Morphine gtt, Glycopyrrolate.     All the records are reviewed and case discussed with Care Management/Social Worker. Management plans discussed with the patient, family and they are in agreement.  CODE STATUS: DNR/COMFORT CARE  DVT Prophylaxis: None  TOTAL TIME TAKING CARE OF THIS PATIENT: 25 minutes.   POSSIBLE D/C unclear. DEPENDING ON progress.    Houston Siren M.D on 11/28/2016 at 3:00 PM  Between 7am to 6pm - Pager - [phone]  After 6pm go to [email or web address] - Therapist, [facility]  Office  [phone]  CC: Primary care physician; Center,  [facility]

## 2016-11-28 NOTE — Progress Notes (Signed)
Pt transferred to 1A via bed. Report and handoff given.

## 2016-11-28 NOTE — Progress Notes (Signed)
PHARMACY - PHYSICIAN COMMUNICATION CRITICAL VALUE ALERT - BLOOD CULTURE IDENTIFICATION (BCID)  No results found for this or any previous visit.   Lab called with report of 1/4 Anaerobic GPC, Staph, Mec A -   Name of physician (or Provider) Contacted: Kasa  Changes to prescribed antibiotics required: Patient made comfort care.   Kyle Vega L 11/28/2016  9:22 AM

## 2016-11-28 NOTE — Progress Notes (Signed)
PT Cancellation Note  Patient Details Name: [facility]. MRN: [id number] DOB: 1930/03/13   Cancelled Treatment:    Reason Eval/Treat Not Completed: Medical issues which prohibited therapy (Consult received and chart reviewed.  Noted patient transition to comfort care.  Will discontinue PT orders at this time.  Please re-consult should needs/goals of care change.)   Nakayla Rorabaugh H. Manson Passey, PT, DPT, NCS 11/28/16, 10:05 AM [phone]

## 2016-11-28 NOTE — Consult Note (Signed)
Referring Physician: Cherlynn Kaiser    Chief Complaint: Right sided weakness  HPI: [facility] Kyle Vega. is an 83 y.o. male who is nonverbal during this evaluation and unable to provide any history.  No family available at this time therefore all history obtained from the chart.  Patient was found down at home.  LKW was 2 days prior to being found.  Was felt to be in rhabdo and septic.  Head CT showed a left MCA infarct.  Patient was admitted for further evaluation.  Initial NIHSS of 33.    Date last known well: Date: 11/26/2016 Time last known well: Unable to determine tPA Given: No: Outside time window  Past Medical History:  Diagnosis Date  . Atrial fibrillation (HCC)   . CAD (coronary artery disease)   . HLD (hyperlipidemia)   . HTN (hypertension)     Past Surgical History:  Procedure Laterality Date  . FRACTURE SURGERY      Family History  Problem Relation Age of Onset  . Family history unknown: Yes   Social History:  reports that he has quit smoking. He does not have any smokeless tobacco history on file. He reports that he does not drink alcohol or use drugs.  Allergies:  Allergies  Allergen Reactions  . Other     Contrast dye    Medications:  I have reviewed the patient's current medications. Prior to Admission:  No prescriptions prior to admission.   Scheduled:   ROS: Unable to obtain due to mental status  Physical Examination: Blood pressure 123/71, pulse 82, temperature 98.6 F (37 C), temperature source Axillary, resp. rate (!) 32, SpO2 98 %.  HEENT-  Normocephalic, no lesions, without obvious abnormality.  Normal external eye and conjunctiva.  Normal TM's bilaterally.  Normal auditory canals and external ears. Normal external nose, mucus membranes and septum.  Normal pharynx. Cardiovascular- S1, S2 normal, pulses palpable throughout   Lungs- chest clear, no wheezing, rales, normal symmetric air entry Abdomen- soft, non-tender; bowel sounds normal; no  masses,  no organomegaly Extremities- no edema Lymph-no adenopathy palpable Musculoskeletal-no joint tenderness, deformity or swelling Skin-warm and dry, no hyperpigmentation, vitiligo, or suspicious lesions  Neurological Examination   Mental Status: Lethargic.  No speech.  Does not follow commands.   Cranial Nerves: II: Discs flat bilaterally; RHH, pupils equal, round, reactive to light and accommodation III,IV, VI: Intact oculocephalic maneuvers bilaterally V,VII: Absent right corneal VIII: hearing normal bilaterally IX,X: gag reflex reduced XI: unable to perform XII: unable to perform Motor: 0/5 RUE and RLE strength.  Minimal movement noted in the LUE.  Some withdrawal noted with the LLE.   Sensory: Responds to noxious stimuli in the LLE Deep Tendon Reflexes: 2+ and symmetric with absent AJ's bilaterally Plantars: Right: upgoing   Left: upgoing Cerebellar: Unable to perform Gait: Unable to perform   Laboratory Studies:  Basic Metabolic Panel:  Recent Labs Lab December 02, 2016 1104 11/28/16 0130  NA 143 144  K 4.4 3.8  CL 103 109  CO2 27 24  GLUCOSE 128* 121*  BUN 57* 64*  CREATININE 1.32* 1.40*  CALCIUM 9.6 8.2*  MG  --  2.6*  PHOS  --  4.9*    Liver Function Tests:  Recent Labs Lab 2016-12-02 1104 11/28/16 0130  AST 55* 48*  ALT 33 25  ALKPHOS 91 65  BILITOT 3.6* 2.6*  PROT 7.5 5.7*  ALBUMIN 4.0 3.0*   No results for input(s): LIPASE, AMYLASE in the last 168 hours. No results for input(s):  AMMONIA in the last 168 hours.  CBC:  Recent Labs Lab 11/28/2016 1104 11/28/16 0130  WBC 9.8 6.5  NEUTROABS 8.6*  --   HGB 17.8 15.1  HCT 52.8* 44.9  MCV 90.2 88.9  PLT 188 169    Cardiac Enzymes:  Recent Labs Lab 11/17/2016 1056 11/11/2016 1104 11/05/2016 1838 11/28/16 0120 11/28/16 0704  CKTOTAL 1,054*  --   --  694*  --   TROPONINI 0.36* 0.36* 0.38* 0.44* 0.37*    BNP: Invalid input(s): POCBNP  CBG: No results for input(s): GLUCAP in the last 168  hours.  Microbiology: Results for orders placed or performed during the hospital encounter of 11/24/2016  Culture, blood (routine x 2)     Status: None (Preliminary result)   Collection Time: 11/05/2016 11:04 AM  Result Value Ref Range Status   Specimen Description RIGHT ANTECUBITAL  Final   Special Requests   Final    BOTTLES DRAWN AEROBIC AND ANAEROBIC Blood Culture adequate volume   Culture  Setup Time   Final    GRAM POSITIVE COCCI IN BOTH AEROBIC AND ANAEROBIC BOTTLES CRITICAL RESULT CALLED TO, READ BACK BY AND VERIFIED WITH: HANK ZOMPA AT 1610 11/28/16 SDR    Culture GRAM POSITIVE COCCI  Final   Report Status PENDING  Incomplete  Blood Culture ID Panel (Reflexed)     Status: Abnormal   Collection Time: 11/26/2016 11:04 AM  Result Value Ref Range Status   Enterococcus species NOT DETECTED NOT DETECTED Final   Listeria monocytogenes NOT DETECTED NOT DETECTED Final   Staphylococcus species DETECTED (A) NOT DETECTED Final    Comment: Methicillin (oxacillin) susceptible coagulase negative staphylococcus. Possible blood culture contaminant (unless isolated from more than one blood culture draw or clinical case suggests pathogenicity). No antibiotic treatment is indicated for blood  culture contaminants. CRITICAL RESULT CALLED TO, READ BACK BY AND VERIFIED WITH: HANK ZOMPA ON 11/28/16 AT 0732 SDR    Staphylococcus aureus NOT DETECTED NOT DETECTED Final   Methicillin resistance NOT DETECTED NOT DETECTED Final   Streptococcus species NOT DETECTED NOT DETECTED Final   Streptococcus agalactiae NOT DETECTED NOT DETECTED Final   Streptococcus pneumoniae NOT DETECTED NOT DETECTED Final   Streptococcus pyogenes NOT DETECTED NOT DETECTED Final   Acinetobacter baumannii NOT DETECTED NOT DETECTED Final   Enterobacteriaceae species NOT DETECTED NOT DETECTED Final   Enterobacter cloacae complex NOT DETECTED NOT DETECTED Final   Escherichia coli NOT DETECTED NOT DETECTED Final   Klebsiella oxytoca  NOT DETECTED NOT DETECTED Final   Klebsiella pneumoniae NOT DETECTED NOT DETECTED Final   Proteus species NOT DETECTED NOT DETECTED Final   Serratia marcescens NOT DETECTED NOT DETECTED Final   Haemophilus influenzae NOT DETECTED NOT DETECTED Final   Neisseria meningitidis NOT DETECTED NOT DETECTED Final   Pseudomonas aeruginosa NOT DETECTED NOT DETECTED Final   Candida albicans NOT DETECTED NOT DETECTED Final   Candida glabrata NOT DETECTED NOT DETECTED Final   Candida krusei NOT DETECTED NOT DETECTED Final   Candida parapsilosis NOT DETECTED NOT DETECTED Final   Candida tropicalis NOT DETECTED NOT DETECTED Final  Culture, blood (routine x 2)     Status: None (Preliminary result)   Collection Time: 12/03/2016 11:09 AM  Result Value Ref Range Status   Specimen Description LEFT ANTECUBITAL  Final   Special Requests   Final    BOTTLES DRAWN AEROBIC AND ANAEROBIC Blood Culture adequate volume   Culture NO GROWTH < 24 HOURS  Final   Report Status PENDING  Incomplete  MRSA PCR Screening     Status: None   Collection Time: 11/28/16  8:34 AM  Result Value Ref Range Status   MRSA by PCR NEGATIVE NEGATIVE Final    Comment:        The GeneXpert MRSA Assay (FDA approved for NASAL specimens only), is one component of a comprehensive MRSA colonization surveillance program. It is not intended to diagnose MRSA infection nor to guide or monitor treatment for MRSA infections.     Coagulation Studies:  Recent Labs  11/26/2016 1104 11/28/16 0130  LABPROT 14.1 15.0  INR 1.10 1.19    Urinalysis:  Recent Labs Lab 11/14/2016 1104  COLORURINE AMBER*  LABSPEC 1.023  PHURINE 5.0  GLUCOSEU NEGATIVE  HGBUR MODERATE*  BILIRUBINUR NEGATIVE  KETONESUR NEGATIVE  PROTEINUR 100*  NITRITE NEGATIVE  LEUKOCYTESUR NEGATIVE    Lipid Panel:    Component Value Date/Time   CHOL 146 11/28/2016 0120   TRIG 105 11/28/2016 0120   HDL 44 11/28/2016 0120   CHOLHDL 3.3 11/28/2016 0120   VLDL 21  11/28/2016 0120   LDLCALC 81 11/28/2016 0120    HgbA1C:  Lab Results  Component Value Date   HGBA1C 6.0 (H) 11/28/2016    Urine Drug Screen:  No results found for: LABOPIA, COCAINSCRNUR, LABBENZ, AMPHETMU, THCU, LABBARB  Alcohol Level: No results for input(s): ETH in the last 168 hours.  Other results: EKG: wide complex tachycardia at 148 bpm.  Imaging: Ct Abdomen Pelvis Wo Contrast  Result Date: 11/09/2016 CLINICAL DATA:  Patient found down with acute mental status change. EXAM: CT CHEST, ABDOMEN AND PELVIS WITHOUT CONTRAST TECHNIQUE: Multidetector CT imaging of the chest, abdomen and pelvis was performed following the standard protocol without IV contrast. COMPARISON:  None. FINDINGS: CT CHEST FINDINGS Cardiovascular: Air in the right jugular, right subclavian, and left brachiocephalic veins is likely due to recent IV placement or injection. Atherosclerotic changes are seen in the nonaneurysmal thoracic aorta and its branching vessels. Cardiomegaly is noted without effusion. The central pulmonary arteries are normal in caliber. Mediastinum/Nodes: No effusions. The thyroid and esophagus are normal. No adenopathy. No mediastinal air. Lungs/Pleura: Central airways are normal. No pneumothorax. Emphysematous changes seen in the lungs. No nodule, mass, or focal infiltrate. Musculoskeletal: There are nondisplaced fractures of the right third and fourth anterior ribs with no pneumothorax. CT ABDOMEN PELVIS FINDINGS Hepatobiliary: Probable tiny cyst in the liver on series 2, image 51, too small to characterize. Another seen on image 23 and perhaps another on image 67. No solid masses. The gallbladder is unremarkable. Pancreas: Unremarkable. No pancreatic ductal dilatation or surrounding inflammatory changes. Spleen: Normal in size without focal abnormality. Adrenals/Urinary Tract: An adenoma is seen in the right adrenal gland. The left adrenal gland is normal. Vascular calcifications are associated with  both kidneys. No renal stones or hydronephrosis. Significant perinephric stranding seen bilaterally, right greater than left. No ureterectasis or ureteral stones. The bladder is decompressed with a Foley catheter. Air in the pelvis is thought to be within decompressed bladder. Stomach/Bowel: The stomach and small bowel are normal. The colon is otherwise normal. The appendix is normal. Vascular/Lymphatic: Atherosclerotic changes are seen in the nonaneurysmal abdominal aorta, iliac vessels, and femoral vessels. No adenopathy. Reproductive: Prostate is unremarkable. Other: There is significant stranding and background both kidneys as above, particularly on the right. There is an elliptical collection of fluid between the right kidney and liver measuring 3.9 by 1.9 cm. This is favored to be due to the right renal stranding  in is no underlying hepatic abnormality is identified. Musculoskeletal: Anterior wedging of L1 and L4 is age indeterminate with no acute fracture line seen. IMPRESSION: 1. Nondisplaced fractures of the right anterior third and fourth ribs with no pneumothorax. 2. Emphysematous changes in the lungs. Atherosclerotic changes in the thoracic aorta. 3. Significant perinephric stranding bilaterally, right greater than left. This is an age-indeterminate finding. There is no associated hydronephrosis. I suspect the stranding on the left is chronic. However, the stranding on the right is much greater and an the underlying acute process is not excluded. A small fluid collection between the liver and right kidney is thought to be renal in origin. Renal injury or infection could result in these findings. Sequela of acute renal insufficiency is a possibility. Recommend clinical correlation and follow-up as clinically warranted. 4. Atherosclerotic changes in the abdominal aorta and branching vessels. 5. Age-indeterminate anterior wedging of L1 and L4. Aortic aneurysm NOS (ICD10-I71.9). Electronically Signed   By:  Gerome Sam III M.D   On: 12-06-2016 12:25   Dg Forearm Right  Result Date: December 06, 2016 CLINICAL DATA:  Male of unknown age with right forearm deformity, unable to obtain history from patient. Presumed trauma. EXAM: RIGHT FOREARM - 2 VIEW COMPARISON:  None. FINDINGS: Lateral and volar angulated deformity of the distal right radius metadiaphysis appears chronic and healed. There is an associated chronic appearing ulnar styloid fracture. No superimposed carpal bone dislocation identified, although there may be associated carpal degeneration. The underlying bone mineralization appears normal. Grossly normal alignment at the right elbow. Calcified peripheral vascular disease in the right forearm. IMPRESSION: 1. Chronic and healed but abnormally angulated distal right radius fracture. Associated chronic ulnar styloid fracture. 2.  No acute osseous abnormality identified. 3. Calcified peripheral vascular disease. Electronically Signed   By: Odessa Fleming M.D.   On: Dec 06, 2016 11:41   Ct Head Wo Contrast  Result Date: December 06, 2016 CLINICAL DATA:  Male of unknown age found down after not being heard from in a few days. Found covered in blood and feces. EXAM: CT HEAD WITHOUT CONTRAST CT CERVICAL SPINE WITHOUT CONTRAST TECHNIQUE: Multidetector CT imaging of the head and cervical spine was performed following the standard protocol without intravenous contrast. Multiplanar CT image reconstructions of the cervical spine were also generated. COMPARISON:  None. FINDINGS: CT HEAD FINDINGS Brain: There is an oval 4 x 6 cm area of hypodensity involving both gray and white matter in the anterior left MCA territory affecting the insula and frontal operculum. Lesser involvement of the anterior left temporal lobe. Heterogeneous density within the affected area is compatible with petechial hemorrhage (coronal image 22). In the left sylvian fissure there is a hyperdense vessel (coronal image 28). There is no significant mass effect. No  intracranial midline shift. Patent left lateral ventricle. No ventriculomegaly. No other intracranial hemorrhage identified. Gray-white matter differentiation elsewhere appears within normal limits. Vascular: Calcified atherosclerosis at the skull base. Hyperdense vessel suspected in the left sylvian fissure as stated above. Skull: Osteopenia.  No skull fracture identified. Sinuses/Orbits: Mild anterior ethmoid and mild to moderate right sphenoid sinus mucosal thickening. Other visible paranasal sinuses and mastoids are well pneumatized. Other: Visualized orbit soft tissues are within normal limits. There is some calcified scalp vessel atherosclerosis. There is a mild broad-based vertex and right greater than left posterior convexity scalp hematoma. CT CERVICAL SPINE FINDINGS Alignment: Mildly exaggerated cervical lordosis. Bilateral posterior element alignment is within normal limits. Cervicothoracic junction alignment is within normal limits. Skull base and vertebrae: Osteopenia. Visualized  skull base is intact. No atlanto-occipital dissociation. No cervical spine fracture identified. Soft tissues and spinal canal: No prevertebral fluid or swelling. No visible canal hematoma. Extensive bilateral carotid calcified atherosclerosis. Small volume retained secretions in the pharynx. Disc levels: Degenerative changes primarily at the anterior C1-odontoid, including joint space loss osteophytosis and subchondral cysts. Upper chest: Visible upper thoracic levels appear grossly intact. Centrilobular emphysema in the lung apices. Extensive calcified proximal great vessels. IMPRESSION: 1. Moderate-sized area of abnormal mixed density in the left hemisphere centered at the insula and operculum most resembles a subacute anterior division Left MCA infarct with petechial hemorrhage, with evidence of a hyperdense vessel in the left sylvian fissure. Follow-up Brain MRI and intracranial MRA would confirm. 2. No malignant  hemorrhagic transformation or intracranial mass effect. 3. Broad-based scalp hematoma without underlying skull fracture. 4. No acute fracture or listhesis in the cervical spine. 5. Extensive great vessel and carotid artery calcified atherosclerosis. Electronically Signed   By: Odessa Fleming M.D.   On: 12-23-16 12:11   Ct Chest Wo Contrast  Result Date: 12/23/2016 CLINICAL DATA:  Patient found down with acute mental status change. EXAM: CT CHEST, ABDOMEN AND PELVIS WITHOUT CONTRAST TECHNIQUE: Multidetector CT imaging of the chest, abdomen and pelvis was performed following the standard protocol without IV contrast. COMPARISON:  None. FINDINGS: CT CHEST FINDINGS Cardiovascular: Air in the right jugular, right subclavian, and left brachiocephalic veins is likely due to recent IV placement or injection. Atherosclerotic changes are seen in the nonaneurysmal thoracic aorta and its branching vessels. Cardiomegaly is noted without effusion. The central pulmonary arteries are normal in caliber. Mediastinum/Nodes: No effusions. The thyroid and esophagus are normal. No adenopathy. No mediastinal air. Lungs/Pleura: Central airways are normal. No pneumothorax. Emphysematous changes seen in the lungs. No nodule, mass, or focal infiltrate. Musculoskeletal: There are nondisplaced fractures of the right third and fourth anterior ribs with no pneumothorax. CT ABDOMEN PELVIS FINDINGS Hepatobiliary: Probable tiny cyst in the liver on series 2, image 51, too small to characterize. Another seen on image 29 and perhaps another on image 67. No solid masses. The gallbladder is unremarkable. Pancreas: Unremarkable. No pancreatic ductal dilatation or surrounding inflammatory changes. Spleen: Normal in size without focal abnormality. Adrenals/Urinary Tract: An adenoma is seen in the right adrenal gland. The left adrenal gland is normal. Vascular calcifications are associated with both kidneys. No renal stones or hydronephrosis. Significant  perinephric stranding seen bilaterally, right greater than left. No ureterectasis or ureteral stones. The bladder is decompressed with a Foley catheter. Air in the pelvis is thought to be within decompressed bladder. Stomach/Bowel: The stomach and small bowel are normal. The colon is otherwise normal. The appendix is normal. Vascular/Lymphatic: Atherosclerotic changes are seen in the nonaneurysmal abdominal aorta, iliac vessels, and femoral vessels. No adenopathy. Reproductive: Prostate is unremarkable. Other: There is significant stranding and background both kidneys as above, particularly on the right. There is an elliptical collection of fluid between the right kidney and liver measuring 3.9 by 1.9 cm. This is favored to be due to the right renal stranding in is no underlying hepatic abnormality is identified. Musculoskeletal: Anterior wedging of L1 and L4 is age indeterminate with no acute fracture line seen. IMPRESSION: 1. Nondisplaced fractures of the right anterior third and fourth ribs with no pneumothorax. 2. Emphysematous changes in the lungs. Atherosclerotic changes in the thoracic aorta. 3. Significant perinephric stranding bilaterally, right greater than left. This is an age-indeterminate finding. There is no associated hydronephrosis. I suspect the stranding  on the left is chronic. However, the stranding on the right is much greater and an the underlying acute process is not excluded. A small fluid collection between the liver and right kidney is thought to be renal in origin. Renal injury or infection could result in these findings. Sequela of acute renal insufficiency is a possibility. Recommend clinical correlation and follow-up as clinically warranted. 4. Atherosclerotic changes in the abdominal aorta and branching vessels. 5. Age-indeterminate anterior wedging of L1 and L4. Aortic aneurysm NOS (ICD10-I71.9). Electronically Signed   By: Gerome Sam III M.D   On: 2016/12/01 12:25   Ct Cervical  Spine Wo Contrast  Result Date: 12/01/16 CLINICAL DATA:  Male of unknown age found down after not being heard from in a few days. Found covered in blood and feces. EXAM: CT HEAD WITHOUT CONTRAST CT CERVICAL SPINE WITHOUT CONTRAST TECHNIQUE: Multidetector CT imaging of the head and cervical spine was performed following the standard protocol without intravenous contrast. Multiplanar CT image reconstructions of the cervical spine were also generated. COMPARISON:  None. FINDINGS: CT HEAD FINDINGS Brain: There is an oval 4 x 6 cm area of hypodensity involving both gray and white matter in the anterior left MCA territory affecting the insula and frontal operculum. Lesser involvement of the anterior left temporal lobe. Heterogeneous density within the affected area is compatible with petechial hemorrhage (coronal image 22). In the left sylvian fissure there is a hyperdense vessel (coronal image 28). There is no significant mass effect. No intracranial midline shift. Patent left lateral ventricle. No ventriculomegaly. No other intracranial hemorrhage identified. Gray-white matter differentiation elsewhere appears within normal limits. Vascular: Calcified atherosclerosis at the skull base. Hyperdense vessel suspected in the left sylvian fissure as stated above. Skull: Osteopenia.  No skull fracture identified. Sinuses/Orbits: Mild anterior ethmoid and mild to moderate right sphenoid sinus mucosal thickening. Other visible paranasal sinuses and mastoids are well pneumatized. Other: Visualized orbit soft tissues are within normal limits. There is some calcified scalp vessel atherosclerosis. There is a mild broad-based vertex and right greater than left posterior convexity scalp hematoma. CT CERVICAL SPINE FINDINGS Alignment: Mildly exaggerated cervical lordosis. Bilateral posterior element alignment is within normal limits. Cervicothoracic junction alignment is within normal limits. Skull base and vertebrae: Osteopenia.  Visualized skull base is intact. No atlanto-occipital dissociation. No cervical spine fracture identified. Soft tissues and spinal canal: No prevertebral fluid or swelling. No visible canal hematoma. Extensive bilateral carotid calcified atherosclerosis. Small volume retained secretions in the pharynx. Disc levels: Degenerative changes primarily at the anterior C1-odontoid, including joint space loss osteophytosis and subchondral cysts. Upper chest: Visible upper thoracic levels appear grossly intact. Centrilobular emphysema in the lung apices. Extensive calcified proximal great vessels. IMPRESSION: 1. Moderate-sized area of abnormal mixed density in the left hemisphere centered at the insula and operculum most resembles a subacute anterior division Left MCA infarct with petechial hemorrhage, with evidence of a hyperdense vessel in the left sylvian fissure. Follow-up Brain MRI and intracranial MRA would confirm. 2. No malignant hemorrhagic transformation or intracranial mass effect. 3. Broad-based scalp hematoma without underlying skull fracture. 4. No acute fracture or listhesis in the cervical spine. 5. Extensive great vessel and carotid artery calcified atherosclerosis. Electronically Signed   By: Odessa Fleming M.D.   On: 01-Dec-2016 12:11   Dg Chest Portable 1 View  Result Date: Dec 01, 2016 CLINICAL DATA:  Respiratory distress. EXAM: PORTABLE CHEST 1 VIEW COMPARISON:  12-01-16 and chest CT 12-01-16. FINDINGS: Patient slightly rotated to the right. Lungs are adequately  inflated with minimal elevation of the left hemidiaphragm. There is mild opacification in the left base which is likely due to atelectasis mild stable prominence of the perihilar markings. Mild stable cardiomegaly. There is calcified plaque over the aortic arch. Remainder the exam is unchanged. IMPRESSION: New mild left base opacification likely due to atelectasis. Mild stable cardiomegaly with minimal vascular congestion unchanged. Aortic  Atherosclerosis (ICD10-I70.0). Electronically Signed   By: Elberta Fortis M.D.   On: 11/22/2016 18:45   Dg Chest Port 1 View  Result Date: 11/28/2016 CLINICAL DATA:  Altered level of consciousness and tachycardia EXAM: PORTABLE CHEST 1 VIEW COMPARISON:  None. FINDINGS: Cardiac shadow is enlarged. Aortic calcifications are noted. No focal infiltrate or sizable effusion is seen. No bony abnormality is noted. IMPRESSION: No active disease. Electronically Signed   By: Alcide Clever M.D.   On: 11/30/2016 11:39    Assessment: 83 y.o. male found down.  Head CT reviewed and shows a left MCA infarct with some petechial hemorrhage noted. LDL 81.  A1c 6.0.  Patient with history of atrial fibrillation.  Currently unable to take po.  Further work up recommended.    Stroke Risk Factors - atrial fibrillation, hyperlipidemia and hypertension  Plan: 1. PT consult, OT consult, Speech consult 2. Echocardiogram 3. Carotid dopplers 4. Prophylactic therapy-Rectal ASA 300mg  daily.  Unable to take po and with some evidence of hemorrhage on CT therefore not an anticoagulation candidate at this time.   5. NPO until RN stroke swallow screen 6. Telemetry monitoring 7. Frequent neuro checks    Thana Farr, MD Neurology [phone] 11/28/2016, 1:37 PM

## 2016-11-28 NOTE — Progress Notes (Signed)
Nutrition Brief Note  Patient identified to be seen for low Braden score. Chart reviewed. Patient now transitioning to comfort care.   No nutrition interventions warranted at this time. Please consult RD as needed.   Aaleeyah Bias, MS, RD, LDN Pager: [phone] After Hours Pager: [phone]    

## 2016-11-28 NOTE — Progress Notes (Signed)
   11/28/16 1800  Clinical Encounter Type  Visited With Patient;Health care provider  Visit Type Initial;Spiritual support;Critical Care;Patient actively dying  Referral From Nurse  Spiritual Encounters  Spiritual Needs Prayer  [facility] visited patient room; no family present; patient non-responsive; silent prayer and ministry of presence.

## 2016-11-29 LAB — URINE CULTURE: Culture: NO GROWTH

## 2016-11-30 LAB — CULTURE, BLOOD (ROUTINE X 2): Special Requests: ADEQUATE

## 2016-12-02 LAB — CULTURE, BLOOD (ROUTINE X 2)
Culture: NO GROWTH
Special Requests: ADEQUATE

## 2016-12-05 NOTE — Death Summary Note (Signed)
DEATH SUMMARY   Patient Details  Name: [facility] Kyle Vega. MRN: [id number] DOB: Sep 27, 1930  Admission/Discharge Information   Admit Date:  2016-12-15  Date of Death: Date of Death: Dec 17, 2016  Time of Death: Time of Death: 0125  Length of Stay: 2  Referring Physician: [facility]   Reason(s) for Hospitalization  Altered Mental Status, Acute CVA, Severe Sepsis.   Diagnoses  Preliminary cause of death:  Secondary Diagnoses (including complications and co-morbidities):  Principal Problem:   Stroke [facility]) Active Problems:   Rhabdomyolysis   Rib fractures   AKI (acute kidney injury) (HCC)   Wound infection   Atrial fibrillation with RVR (HCC)   Sepsis (HCC)   Severe sepsis [facility])   Brief Hospital Course (including significant findings, care, treatment, and services provided and events leading to death)  Kyle Vega. is a 83 y.o. year old male who has a past medical history of hypertension, hyperlipidemia, history of coronary artery atrial fibrillation who presents to the hospital as he was found unresponsive noted to have a left MCA stroke.  1. Acute Left MCA CVA 2. Altered Mental status due to # 1.  3. Acute Rhabdomyolysis.  4. Acute Renal Failure.  5. Elevated Troponin.  6. A. Fib w/ RVR 7. Right sided Rib Fractures.   Pt's mental status was not improving and he was noted to have airway compromise due to his CVA.  Intensivist discussed with pt's POA about pt's goals of care and they did not want to pursue aggressive care and pt. Was made COMFORT CARE ONLY.  - cont. Morphine gtt, Glycopyrrolate.    Pt. Passed away on the early Morning of December 17, 2016  Pertinent Labs and Studies  Significant Diagnostic Studies Ct Abdomen Pelvis Wo Contrast  Result Date: Dec 15, 2016 CLINICAL DATA:  Patient found down with acute mental status change. EXAM: CT CHEST, ABDOMEN AND PELVIS WITHOUT CONTRAST TECHNIQUE: Multidetector CT imaging of the chest, abdomen  and pelvis was performed following the standard protocol without IV contrast. COMPARISON:  None. FINDINGS: CT CHEST FINDINGS Cardiovascular: Air in the right jugular, right subclavian, and left brachiocephalic veins is likely due to recent IV placement or injection. Atherosclerotic changes are seen in the nonaneurysmal thoracic aorta and its branching vessels. Cardiomegaly is noted without effusion. The central pulmonary arteries are normal in caliber. Mediastinum/Nodes: No effusions. The thyroid and esophagus are normal. No adenopathy. No mediastinal air. Lungs/Pleura: Central airways are normal. No pneumothorax. Emphysematous changes seen in the lungs. No nodule, mass, or focal infiltrate. Musculoskeletal: There are nondisplaced fractures of the right third and fourth anterior ribs with no pneumothorax. CT ABDOMEN PELVIS FINDINGS Hepatobiliary: Probable tiny cyst in the liver on series 2, image 51, too small to characterize. Another seen on image 20 and perhaps another on image 67. No solid masses. The gallbladder is unremarkable. Pancreas: Unremarkable. No pancreatic ductal dilatation or surrounding inflammatory changes. Spleen: Normal in size without focal abnormality. Adrenals/Urinary Tract: An adenoma is seen in the right adrenal gland. The left adrenal gland is normal. Vascular calcifications are associated with both kidneys. No renal stones or hydronephrosis. Significant perinephric stranding seen bilaterally, right greater than left. No ureterectasis or ureteral stones. The bladder is decompressed with a Foley catheter. Air in the pelvis is thought to be within decompressed bladder. Stomach/Bowel: The stomach and small bowel are normal. The colon is otherwise normal. The appendix is normal. Vascular/Lymphatic: Atherosclerotic changes are seen in the nonaneurysmal abdominal aorta, iliac vessels, and femoral vessels. No adenopathy.  Reproductive: Prostate is unremarkable. Other: There is significant stranding  and background both kidneys as above, particularly on the right. There is an elliptical collection of fluid between the right kidney and liver measuring 3.9 by 1.9 cm. This is favored to be due to the right renal stranding in is no underlying hepatic abnormality is identified. Musculoskeletal: Anterior wedging of L1 and L4 is age indeterminate with no acute fracture line seen. IMPRESSION: 1. Nondisplaced fractures of the right anterior third and fourth ribs with no pneumothorax. 2. Emphysematous changes in the lungs. Atherosclerotic changes in the thoracic aorta. 3. Significant perinephric stranding bilaterally, right greater than left. This is an age-indeterminate finding. There is no associated hydronephrosis. I suspect the stranding on the left is chronic. However, the stranding on the right is much greater and an the underlying acute process is not excluded. A small fluid collection between the liver and right kidney is thought to be renal in origin. Renal injury or infection could result in these findings. Sequela of acute renal insufficiency is a possibility. Recommend clinical correlation and follow-up as clinically warranted. 4. Atherosclerotic changes in the abdominal aorta and branching vessels. 5. Age-indeterminate anterior wedging of L1 and L4. Aortic aneurysm NOS (ICD10-I71.9). Electronically Signed   By: Gerome Sam III M.D   On: 11/11/2016 12:25   Dg Forearm Right  Result Date: 11/26/2016 CLINICAL DATA:  Male of unknown age with right forearm deformity, unable to obtain history from patient. Presumed trauma. EXAM: RIGHT FOREARM - 2 VIEW COMPARISON:  None. FINDINGS: Lateral and volar angulated deformity of the distal right radius metadiaphysis appears chronic and healed. There is an associated chronic appearing ulnar styloid fracture. No superimposed carpal bone dislocation identified, although there may be associated carpal degeneration. The underlying bone mineralization appears normal.  Grossly normal alignment at the right elbow. Calcified peripheral vascular disease in the right forearm. IMPRESSION: 1. Chronic and healed but abnormally angulated distal right radius fracture. Associated chronic ulnar styloid fracture. 2.  No acute osseous abnormality identified. 3. Calcified peripheral vascular disease. Electronically Signed   By: Odessa Fleming M.D.   On: 11/26/2016 11:41   Ct Head Wo Contrast  Result Date: 11/15/2016 CLINICAL DATA:  Male of unknown age found down after not being heard from in a few days. Found covered in blood and feces. EXAM: CT HEAD WITHOUT CONTRAST CT CERVICAL SPINE WITHOUT CONTRAST TECHNIQUE: Multidetector CT imaging of the head and cervical spine was performed following the standard protocol without intravenous contrast. Multiplanar CT image reconstructions of the cervical spine were also generated. COMPARISON:  None. FINDINGS: CT HEAD FINDINGS Brain: There is an oval 4 x 6 cm area of hypodensity involving both gray and white matter in the anterior left MCA territory affecting the insula and frontal operculum. Lesser involvement of the anterior left temporal lobe. Heterogeneous density within the affected area is compatible with petechial hemorrhage (coronal image 22). In the left sylvian fissure there is a hyperdense vessel (coronal image 28). There is no significant mass effect. No intracranial midline shift. Patent left lateral ventricle. No ventriculomegaly. No other intracranial hemorrhage identified. Gray-white matter differentiation elsewhere appears within normal limits. Vascular: Calcified atherosclerosis at the skull base. Hyperdense vessel suspected in the left sylvian fissure as stated above. Skull: Osteopenia.  No skull fracture identified. Sinuses/Orbits: Mild anterior ethmoid and mild to moderate right sphenoid sinus mucosal thickening. Other visible paranasal sinuses and mastoids are well pneumatized. Other: Visualized orbit soft tissues are within normal  limits. There is some  calcified scalp vessel atherosclerosis. There is a mild broad-based vertex and right greater than left posterior convexity scalp hematoma. CT CERVICAL SPINE FINDINGS Alignment: Mildly exaggerated cervical lordosis. Bilateral posterior element alignment is within normal limits. Cervicothoracic junction alignment is within normal limits. Skull base and vertebrae: Osteopenia. Visualized skull base is intact. No atlanto-occipital dissociation. No cervical spine fracture identified. Soft tissues and spinal canal: No prevertebral fluid or swelling. No visible canal hematoma. Extensive bilateral carotid calcified atherosclerosis. Small volume retained secretions in the pharynx. Disc levels: Degenerative changes primarily at the anterior C1-odontoid, including joint space loss osteophytosis and subchondral cysts. Upper chest: Visible upper thoracic levels appear grossly intact. Centrilobular emphysema in the lung apices. Extensive calcified proximal great vessels. IMPRESSION: 1. Moderate-sized area of abnormal mixed density in the left hemisphere centered at the insula and operculum most resembles a subacute anterior division Left MCA infarct with petechial hemorrhage, with evidence of a hyperdense vessel in the left sylvian fissure. Follow-up Brain MRI and intracranial MRA would confirm. 2. No malignant hemorrhagic transformation or intracranial mass effect. 3. Broad-based scalp hematoma without underlying skull fracture. 4. No acute fracture or listhesis in the cervical spine. 5. Extensive great vessel and carotid artery calcified atherosclerosis. Electronically Signed   By: Odessa Fleming M.D.   On: 12/07/16 12:11   Ct Chest Wo Contrast  Result Date: 12-07-16 CLINICAL DATA:  Patient found down with acute mental status change. EXAM: CT CHEST, ABDOMEN AND PELVIS WITHOUT CONTRAST TECHNIQUE: Multidetector CT imaging of the chest, abdomen and pelvis was performed following the standard protocol without  IV contrast. COMPARISON:  None. FINDINGS: CT CHEST FINDINGS Cardiovascular: Air in the right jugular, right subclavian, and left brachiocephalic veins is likely due to recent IV placement or injection. Atherosclerotic changes are seen in the nonaneurysmal thoracic aorta and its branching vessels. Cardiomegaly is noted without effusion. The central pulmonary arteries are normal in caliber. Mediastinum/Nodes: No effusions. The thyroid and esophagus are normal. No adenopathy. No mediastinal air. Lungs/Pleura: Central airways are normal. No pneumothorax. Emphysematous changes seen in the lungs. No nodule, mass, or focal infiltrate. Musculoskeletal: There are nondisplaced fractures of the right third and fourth anterior ribs with no pneumothorax. CT ABDOMEN PELVIS FINDINGS Hepatobiliary: Probable tiny cyst in the liver on series 2, image 51, too small to characterize. Another seen on image 37 and perhaps another on image 67. No solid masses. The gallbladder is unremarkable. Pancreas: Unremarkable. No pancreatic ductal dilatation or surrounding inflammatory changes. Spleen: Normal in size without focal abnormality. Adrenals/Urinary Tract: An adenoma is seen in the right adrenal gland. The left adrenal gland is normal. Vascular calcifications are associated with both kidneys. No renal stones or hydronephrosis. Significant perinephric stranding seen bilaterally, right greater than left. No ureterectasis or ureteral stones. The bladder is decompressed with a Foley catheter. Air in the pelvis is thought to be within decompressed bladder. Stomach/Bowel: The stomach and small bowel are normal. The colon is otherwise normal. The appendix is normal. Vascular/Lymphatic: Atherosclerotic changes are seen in the nonaneurysmal abdominal aorta, iliac vessels, and femoral vessels. No adenopathy. Reproductive: Prostate is unremarkable. Other: There is significant stranding and background both kidneys as above, particularly on the right.  There is an elliptical collection of fluid between the right kidney and liver measuring 3.9 by 1.9 cm. This is favored to be due to the right renal stranding in is no underlying hepatic abnormality is identified. Musculoskeletal: Anterior wedging of L1 and L4 is age indeterminate with no acute fracture line seen. IMPRESSION:  1. Nondisplaced fractures of the right anterior third and fourth ribs with no pneumothorax. 2. Emphysematous changes in the lungs. Atherosclerotic changes in the thoracic aorta. 3. Significant perinephric stranding bilaterally, right greater than left. This is an age-indeterminate finding. There is no associated hydronephrosis. I suspect the stranding on the left is chronic. However, the stranding on the right is much greater and an the underlying acute process is not excluded. A small fluid collection between the liver and right kidney is thought to be renal in origin. Renal injury or infection could result in these findings. Sequela of acute renal insufficiency is a possibility. Recommend clinical correlation and follow-up as clinically warranted. 4. Atherosclerotic changes in the abdominal aorta and branching vessels. 5. Age-indeterminate anterior wedging of L1 and L4. Aortic aneurysm NOS (ICD10-I71.9). Electronically Signed   By: Gerome Sam III M.D   On: December 14, 2016 12:25   Ct Cervical Spine Wo Contrast  Result Date: 12/14/16 CLINICAL DATA:  Male of unknown age found down after not being heard from in a few days. Found covered in blood and feces. EXAM: CT HEAD WITHOUT CONTRAST CT CERVICAL SPINE WITHOUT CONTRAST TECHNIQUE: Multidetector CT imaging of the head and cervical spine was performed following the standard protocol without intravenous contrast. Multiplanar CT image reconstructions of the cervical spine were also generated. COMPARISON:  None. FINDINGS: CT HEAD FINDINGS Brain: There is an oval 4 x 6 cm area of hypodensity involving both gray and white matter in the anterior  left MCA territory affecting the insula and frontal operculum. Lesser involvement of the anterior left temporal lobe. Heterogeneous density within the affected area is compatible with petechial hemorrhage (coronal image 22). In the left sylvian fissure there is a hyperdense vessel (coronal image 28). There is no significant mass effect. No intracranial midline shift. Patent left lateral ventricle. No ventriculomegaly. No other intracranial hemorrhage identified. Gray-white matter differentiation elsewhere appears within normal limits. Vascular: Calcified atherosclerosis at the skull base. Hyperdense vessel suspected in the left sylvian fissure as stated above. Skull: Osteopenia.  No skull fracture identified. Sinuses/Orbits: Mild anterior ethmoid and mild to moderate right sphenoid sinus mucosal thickening. Other visible paranasal sinuses and mastoids are well pneumatized. Other: Visualized orbit soft tissues are within normal limits. There is some calcified scalp vessel atherosclerosis. There is a mild broad-based vertex and right greater than left posterior convexity scalp hematoma. CT CERVICAL SPINE FINDINGS Alignment: Mildly exaggerated cervical lordosis. Bilateral posterior element alignment is within normal limits. Cervicothoracic junction alignment is within normal limits. Skull base and vertebrae: Osteopenia. Visualized skull base is intact. No atlanto-occipital dissociation. No cervical spine fracture identified. Soft tissues and spinal canal: No prevertebral fluid or swelling. No visible canal hematoma. Extensive bilateral carotid calcified atherosclerosis. Small volume retained secretions in the pharynx. Disc levels: Degenerative changes primarily at the anterior C1-odontoid, including joint space loss osteophytosis and subchondral cysts. Upper chest: Visible upper thoracic levels appear grossly intact. Centrilobular emphysema in the lung apices. Extensive calcified proximal great vessels. IMPRESSION: 1.  Moderate-sized area of abnormal mixed density in the left hemisphere centered at the insula and operculum most resembles a subacute anterior division Left MCA infarct with petechial hemorrhage, with evidence of a hyperdense vessel in the left sylvian fissure. Follow-up Brain MRI and intracranial MRA would confirm. 2. No malignant hemorrhagic transformation or intracranial mass effect. 3. Broad-based scalp hematoma without underlying skull fracture. 4. No acute fracture or listhesis in the cervical spine. 5. Extensive great vessel and carotid artery calcified atherosclerosis. Electronically Signed  By: Odessa Fleming M.D.   On: 11/21/2016 12:11   Dg Chest Portable 1 View  Result Date: 11/28/2016 CLINICAL DATA:  Respiratory distress. EXAM: PORTABLE CHEST 1 VIEW COMPARISON:  11/17/2016 and chest CT 12/01/2016. FINDINGS: Patient slightly rotated to the right. Lungs are adequately inflated with minimal elevation of the left hemidiaphragm. There is mild opacification in the left base which is likely due to atelectasis mild stable prominence of the perihilar markings. Mild stable cardiomegaly. There is calcified plaque over the aortic arch. Remainder the exam is unchanged. IMPRESSION: New mild left base opacification likely due to atelectasis. Mild stable cardiomegaly with minimal vascular congestion unchanged. Aortic Atherosclerosis (ICD10-I70.0). Electronically Signed   By: Elberta Fortis M.D.   On: 11/18/2016 18:45   Dg Chest Port 1 View  Result Date: 11/19/2016 CLINICAL DATA:  Altered level of consciousness and tachycardia EXAM: PORTABLE CHEST 1 VIEW COMPARISON:  None. FINDINGS: Cardiac shadow is enlarged. Aortic calcifications are noted. No focal infiltrate or sizable effusion is seen. No bony abnormality is noted. IMPRESSION: No active disease. Electronically Signed   By: Alcide Clever M.D.   On: 11/10/2016 11:39    Microbiology Recent Results (from the past 240 hour(s))  Culture, blood (routine x 2)     Status:  Abnormal (Preliminary result)   Collection Time: 12/03/2016 11:04 AM  Result Value Ref Range Status   Specimen Description RIGHT ANTECUBITAL  Final   Special Requests   Final    BOTTLES DRAWN AEROBIC AND ANAEROBIC Blood Culture adequate volume   Culture  Setup Time   Final    GRAM POSITIVE COCCI IN BOTH AEROBIC AND ANAEROBIC BOTTLES CRITICAL RESULT CALLED TO, READ BACK BY AND VERIFIED WITH: HANK ZOMPA AT 0732 11/28/16 SDR    Culture (A)  Final    STAPHYLOCOCCUS SPECIES (COAGULASE NEGATIVE) THE SIGNIFICANCE OF ISOLATING THIS ORGANISM FROM A SINGLE SET OF BLOOD CULTURES WHEN MULTIPLE SETS ARE DRAWN IS UNCERTAIN. PLEASE NOTIFY THE MICROBIOLOGY DEPARTMENT WITHIN ONE WEEK IF SPECIATION AND SENSITIVITIES ARE REQUIRED. Performed at [facility], [address], [place], [place] [address]    Report Status PENDING  Incomplete  Blood Culture ID Panel (Reflexed)     Status: Abnormal   Collection Time: 11/16/2016 11:04 AM  Result Value Ref Range Status   Enterococcus species NOT DETECTED NOT DETECTED Final   Listeria monocytogenes NOT DETECTED NOT DETECTED Final   Staphylococcus species DETECTED (A) NOT DETECTED Final    Comment: Methicillin (oxacillin) susceptible coagulase negative staphylococcus. Possible blood culture contaminant (unless isolated from more than one blood culture draw or clinical case suggests pathogenicity). No antibiotic treatment is indicated for blood  culture contaminants. CRITICAL RESULT CALLED TO, READ BACK BY AND VERIFIED WITH: HANK ZOMPA ON 11/28/16 AT 0732 SDR    Staphylococcus aureus NOT DETECTED NOT DETECTED Final   Methicillin resistance NOT DETECTED NOT DETECTED Final   Streptococcus species NOT DETECTED NOT DETECTED Final   Streptococcus agalactiae NOT DETECTED NOT DETECTED Final   Streptococcus pneumoniae NOT DETECTED NOT DETECTED Final   Streptococcus pyogenes NOT DETECTED NOT DETECTED Final   Acinetobacter baumannii NOT DETECTED NOT DETECTED Final    Enterobacteriaceae species NOT DETECTED NOT DETECTED Final   Enterobacter cloacae complex NOT DETECTED NOT DETECTED Final   Escherichia coli NOT DETECTED NOT DETECTED Final   Klebsiella oxytoca NOT DETECTED NOT DETECTED Final   Klebsiella pneumoniae NOT DETECTED NOT DETECTED Final   Proteus species NOT DETECTED NOT DETECTED Final   Serratia marcescens NOT  DETECTED NOT DETECTED Final   Haemophilus influenzae NOT DETECTED NOT DETECTED Final   Neisseria meningitidis NOT DETECTED NOT DETECTED Final   Pseudomonas aeruginosa NOT DETECTED NOT DETECTED Final   Candida albicans NOT DETECTED NOT DETECTED Final   Candida glabrata NOT DETECTED NOT DETECTED Final   Candida krusei NOT DETECTED NOT DETECTED Final   Candida parapsilosis NOT DETECTED NOT DETECTED Final   Candida tropicalis NOT DETECTED NOT DETECTED Final  Culture, blood (routine x 2)     Status: None (Preliminary result)   Collection Time: 11/25/2016 11:09 AM  Result Value Ref Range Status   Specimen Description LEFT ANTECUBITAL  Final   Special Requests   Final    BOTTLES DRAWN AEROBIC AND ANAEROBIC Blood Culture adequate volume   Culture NO GROWTH 2 DAYS  Final   Report Status PENDING  Incomplete  Urine Culture     Status: None   Collection Time: 11/28/16  3:08 AM  Result Value Ref Range Status   Specimen Description URINE, RANDOM  Final   Special Requests NONE  Final   Culture   Final    NO GROWTH Performed at [facility], [address], [place], [place] [address]    Report Status 11-30-16 FINAL  Final  MRSA PCR Screening     Status: None   Collection Time: 11/28/16  8:34 AM  Result Value Ref Range Status   MRSA by PCR NEGATIVE NEGATIVE Final    Comment:        The GeneXpert MRSA Assay (FDA approved for NASAL specimens only), is one component of a comprehensive MRSA colonization surveillance program. It is not intended to diagnose MRSA infection nor to guide or monitor treatment for MRSA infections.      Lab Basic Metabolic Panel:  Recent Labs Lab 11/23/2016 1104 11/28/16 0130  NA 143 144  K 4.4 3.8  CL 103 109  CO2 27 24  GLUCOSE 128* 121*  BUN 57* 64*  CREATININE 1.32* 1.40*  CALCIUM 9.6 8.2*  MG  --  2.6*  PHOS  --  4.9*   Liver Function Tests:  Recent Labs Lab 11/07/2016 1104 11/28/16 0130  AST 55* 48*  ALT 33 25  ALKPHOS 91 65  BILITOT 3.6* 2.6*  PROT 7.5 5.7*  ALBUMIN 4.0 3.0*   No results for input(s): LIPASE, AMYLASE in the last 168 hours. No results for input(s): AMMONIA in the last 168 hours. CBC:  Recent Labs Lab 11/10/2016 1104 11/28/16 0130  WBC 9.8 6.5  NEUTROABS 8.6*  --   HGB 17.8 15.1  HCT 52.8* 44.9  MCV 90.2 88.9  PLT 188 169   Cardiac Enzymes:  Recent Labs Lab 11/18/2016 1056 11/26/2016 1104 12/02/2016 1838 11/28/16 0120 11/28/16 0704  CKTOTAL 1,054*  --   --  694*  --   TROPONINI 0.36* 0.36* 0.38* 0.44* 0.37*   Sepsis Labs:  Recent Labs Lab 11/10/2016 1104 11/21/2016 1838 11/28/16 0130  PROCALCITON  --   --  0.19  WBC 9.8  --  6.5  LATICACIDVEN 1.8 1.4 1.7    Procedures/Operations  None   Bertrum Helmstetter J 2016/11/30, 2:52 PM

## 2016-12-05 NOTE — Progress Notes (Signed)
At around 0125, Patient noted to be unresponsive, no visible rise and fall of chest, and pulseless. Writer and fellow RN Rashida haney pronounced patient death. Charge Nurse, Leia Alf, and MD made aware.  Nephew Coburn Knaus contacted at [phone] and he mentioned that he will be on his way.

## 2016-12-05 NOTE — Care Management (Signed)
12/01/2016 0808AM: I have notified [facility] of patient demise.

## 2016-12-05 DEATH — deceased

## 2019-07-18 IMAGING — DX DG CHEST 1V PORT
1 series · 1 of 1 positions shown · non-contrast
Comparison: None.

CLINICAL DATA: Altered level of consciousness and tachycardia

EXAM:
PORTABLE CHEST 1 VIEW

[chest ap]
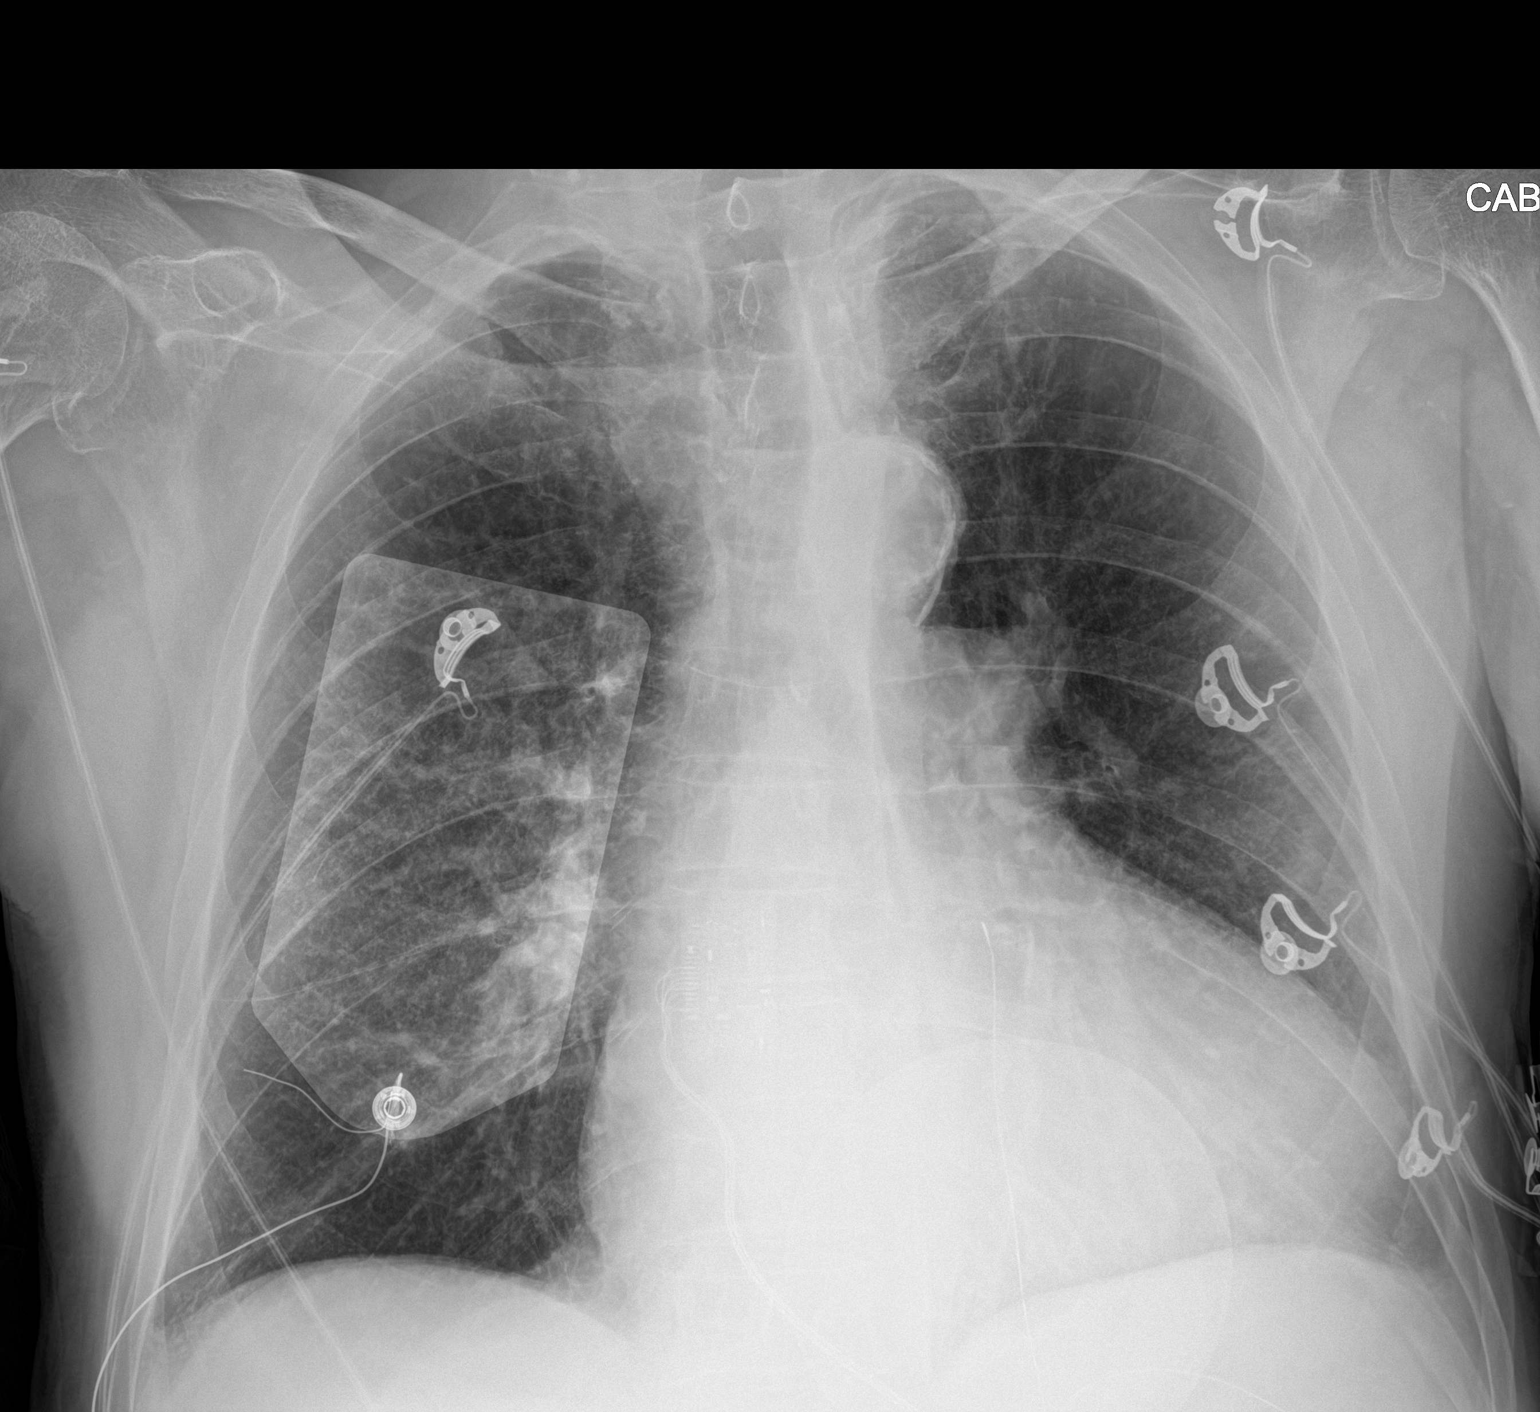

[1 of 1 positions shown; findings below may reference images not displayed]

FINDINGS: Cardiac shadow is enlarged. Aortic calcifications are noted. No
focal infiltrate or sizable effusion is seen. No bony abnormality is
noted.
IMPRESSION: No active disease.

## 2019-07-18 IMAGING — CT CT HEAD W/O CM
4 of 7 series · 14 of 47 positions shown, 15 images · non-contrast
Comparison: None.

CLINICAL DATA: Male of unknown age found down after not being heard
from in a few days. Found covered in blood and feces.

EXAM:
CT HEAD WITHOUT CONTRAST
CT CERVICAL SPINE WITHOUT CONTRAST
TECHNIQUE: Multidetector CT imaging of the head and cervical spine was
performed following the standard protocol without intravenous
contrast. Multiplanar CT image reconstructions of the cervical spine
were also generated.

[Series 2: head wo · axial · 0.43mm/px · z∈[-42,+8]mm · 2 of 30 slices shown, 3 images]
[im 10/30  brain]
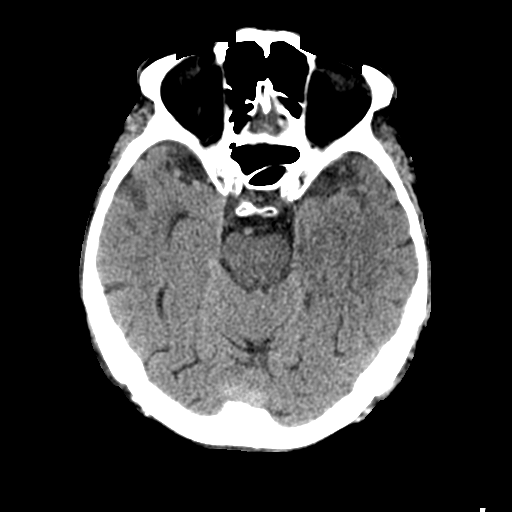
[im 10/30  bone]
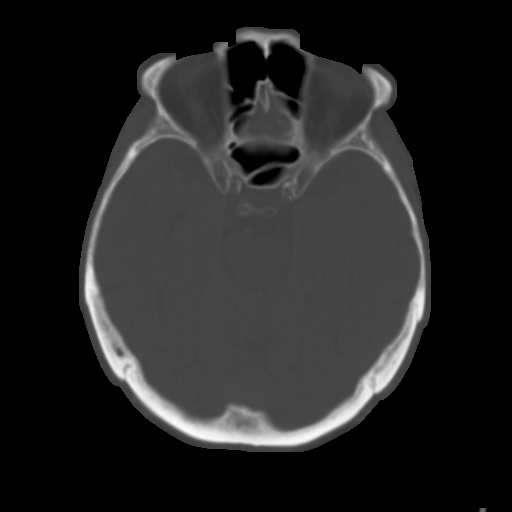
[im 20/30  brain]
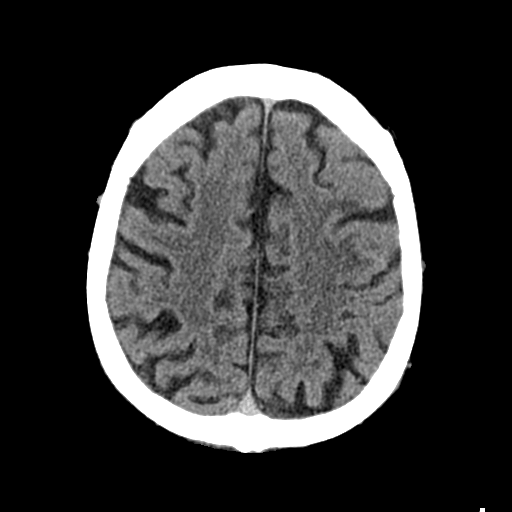

[Series 4: coronal soft tissue · coronal · 0.29mm/px · 3 of 63 slices shown]
[im 4/63  brain]
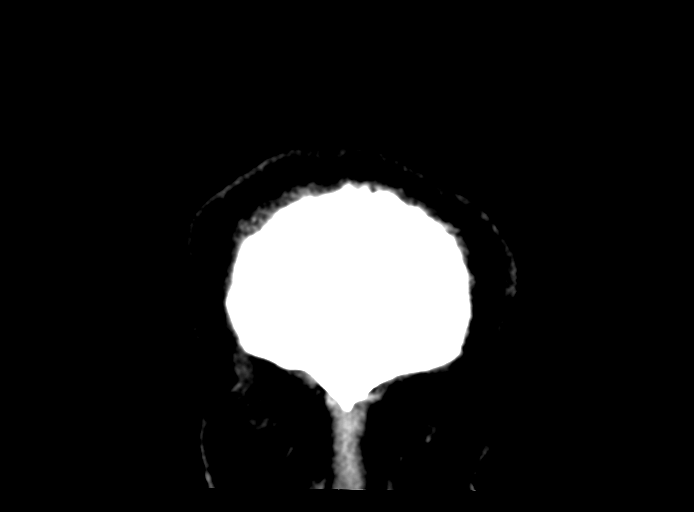
[im 8/63  brain]
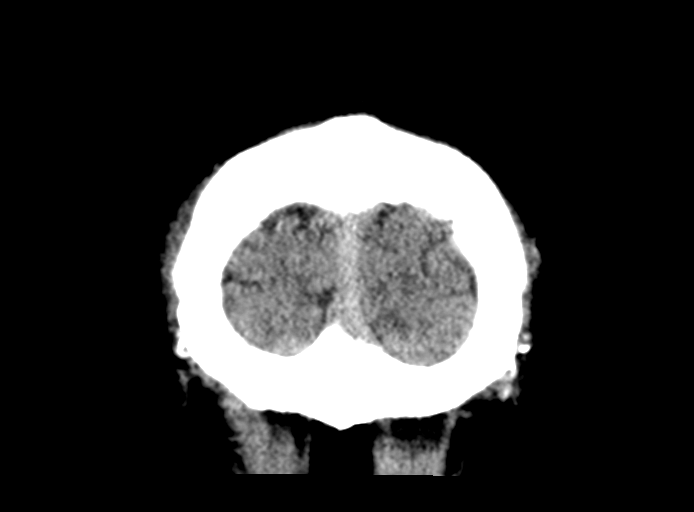
[im 11/63  brain]
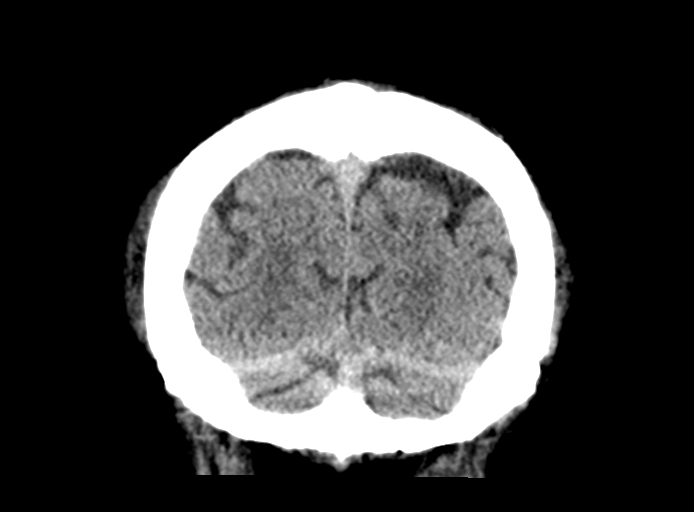

[Series 5: sagittal soft tissue · sagittal · 0.29mm/px · 1 of 54 slices shown]
[im 27/54  brain]
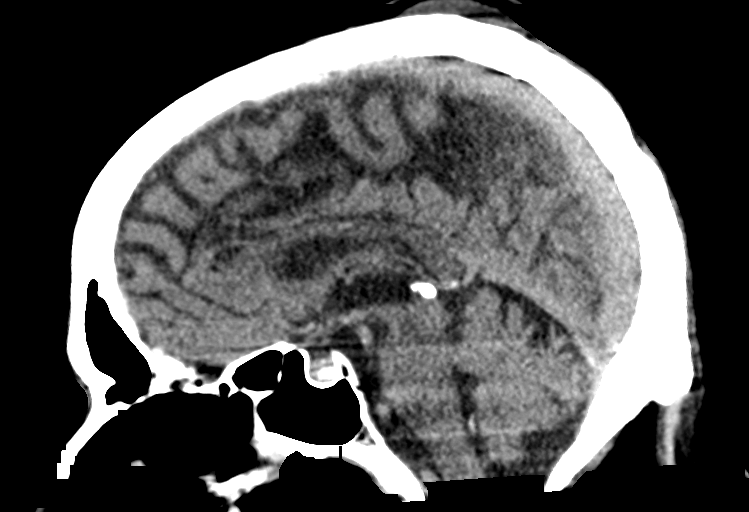

[Series 10: orthogonal bone · axial · 0.23mm/px · z∈[-253,-100]mm · 8 of 99 slices shown]
[im 9/99  bone]
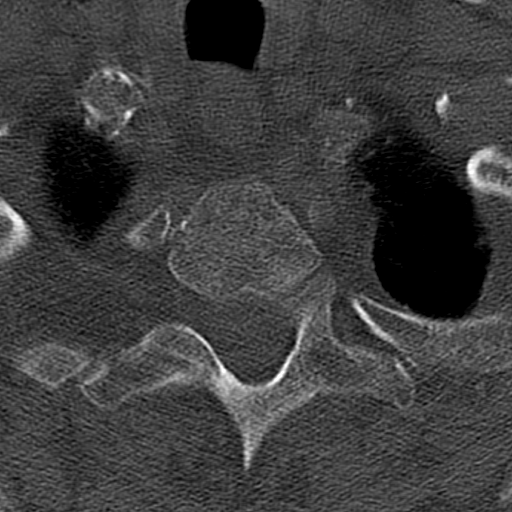
[im 25/99  bone]
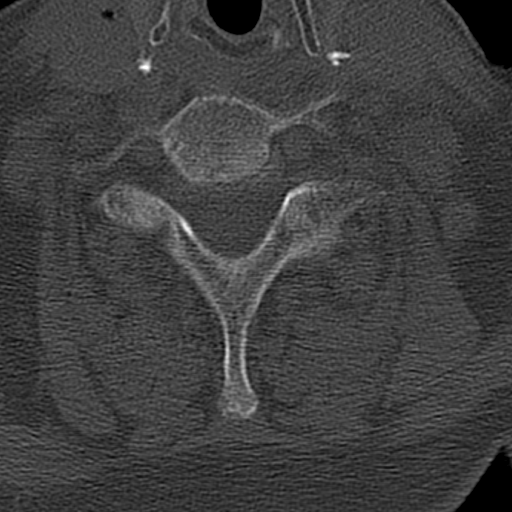
[im 33/99  bone]
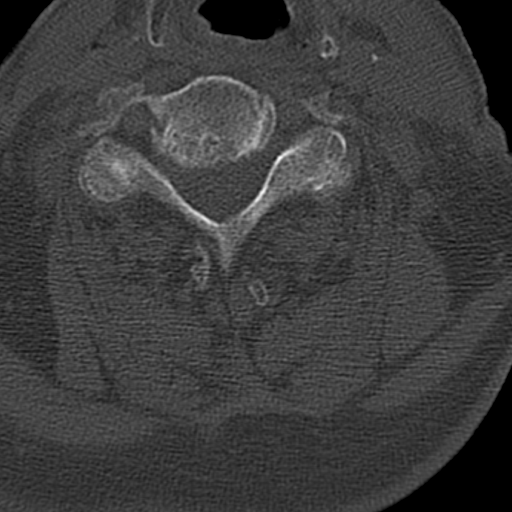
[im 41/99  bone]
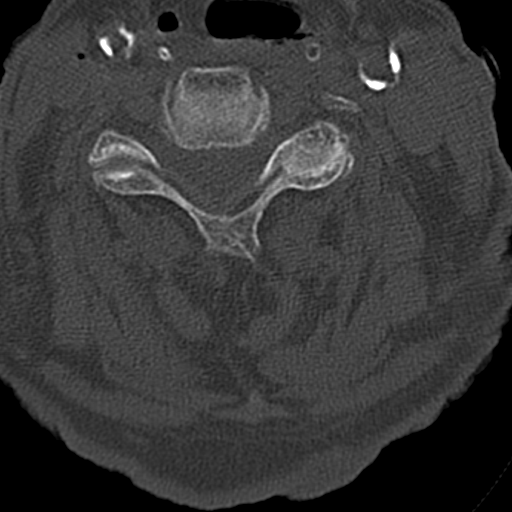
[im 58/99  bone]
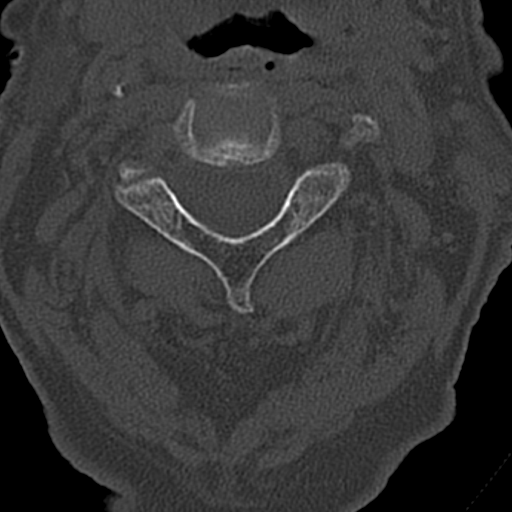
[im 66/99  bone]
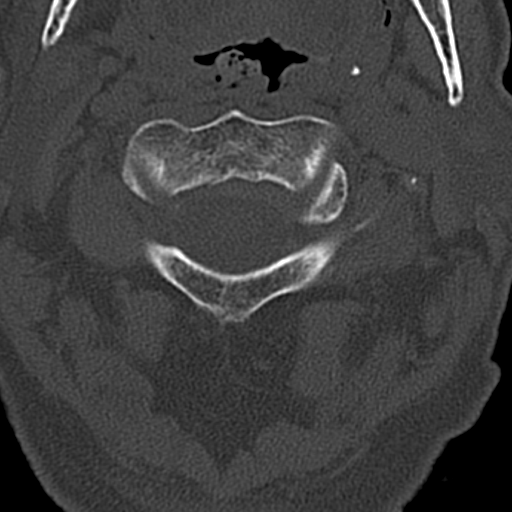
[im 74/99  bone]
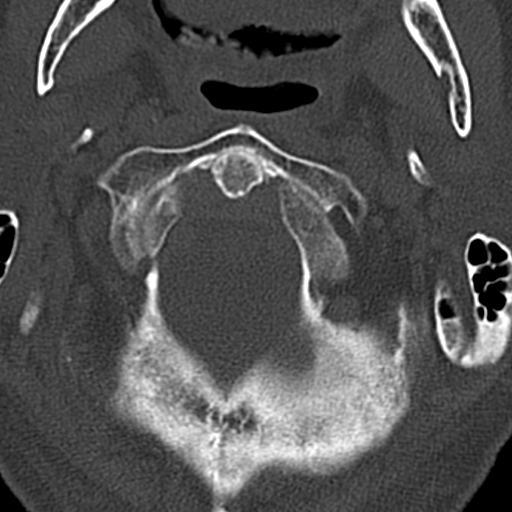
[im 90/99  bone]
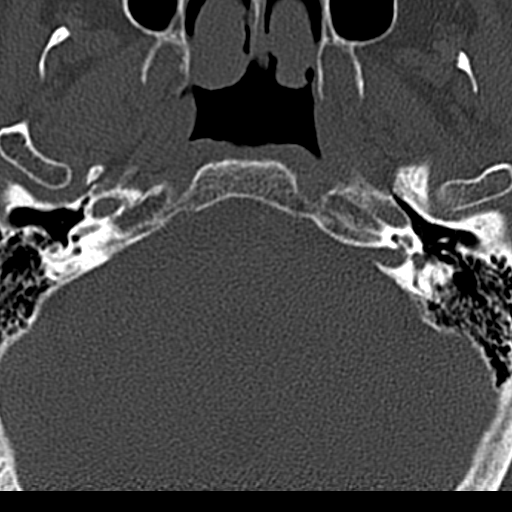

[14 of 47 positions shown; findings below may reference images not displayed]

FINDINGS: CT HEAD FINDINGS

Brain: There is an oval 4 x 6 cm area of hypodensity involving both
gray and white matter in the anterior left MCA territory affecting
the insula and frontal operculum. Lesser involvement of the anterior
left temporal lobe. Heterogeneous density within the affected area
is compatible with petechial hemorrhage (coronal image 22). In the
left sylvian fissure there is a hyperdense vessel (coronal image
28).

There is no significant mass effect. No intracranial midline shift.
Patent left lateral ventricle. No ventriculomegaly. No other
intracranial hemorrhage identified. Gray-white matter
differentiation elsewhere appears within normal limits.

Vascular: Calcified atherosclerosis at the skull base. Hyperdense
vessel suspected in the left sylvian fissure as stated above.

Skull: Osteopenia.  No skull fracture identified.

Sinuses/Orbits: Mild anterior ethmoid and mild to moderate right
sphenoid sinus mucosal thickening. Other visible paranasal sinuses
and mastoids are well pneumatized.

Other: Visualized orbit soft tissues are within normal limits. There
is some calcified scalp vessel atherosclerosis. There is a mild
broad-based vertex and right greater than left posterior convexity
scalp hematoma.

CT CERVICAL SPINE FINDINGS

Alignment: Mildly exaggerated cervical lordosis. Bilateral posterior
element alignment is within normal limits. Cervicothoracic junction
alignment is within normal limits.

Skull base and vertebrae: Osteopenia. Visualized skull base is
intact. No atlanto-occipital dissociation. No cervical spine
fracture identified.

Soft tissues and spinal canal: No prevertebral fluid or swelling. No
visible canal hematoma.

Extensive bilateral carotid calcified atherosclerosis. Small volume
retained secretions in the pharynx.

Disc levels: Degenerative changes primarily at the anterior
C1-odontoid, including joint space loss osteophytosis and
subchondral cysts.

Upper chest: Visible upper thoracic levels appear grossly intact.

Centrilobular emphysema in the lung apices.

Extensive calcified proximal great vessels.
IMPRESSION: 1. Moderate-sized area of abnormal mixed density in the left
hemisphere centered at the insula and operculum most resembles a
subacute anterior division Left MCA infarct with petechial
hemorrhage, with evidence of a hyperdense vessel in the left sylvian
fissure.
Follow-up Brain MRI and intracranial MRA would confirm.
2. No malignant hemorrhagic transformation or intracranial mass
effect.
3. Broad-based scalp hematoma without underlying skull fracture.
4. No acute fracture or listhesis in the cervical spine.
5. Extensive great vessel and carotid artery calcified
atherosclerosis.
